# Patient Record
Sex: Female | Born: 1960 | Race: White | Hispanic: No | Marital: Married | Smoking: Never smoker
Health system: Southern US, Community
[De-identification: ages and names within clinical notes are randomized; demographics above are authoritative.]

## PROBLEM LIST (undated history)

## (undated) DIAGNOSIS — R42 Dizziness and giddiness: Secondary | ICD-10-CM

## (undated) DIAGNOSIS — C50919 Malignant neoplasm of unspecified site of unspecified female breast: Secondary | ICD-10-CM

## (undated) DIAGNOSIS — M1990 Unspecified osteoarthritis, unspecified site: Secondary | ICD-10-CM

## (undated) DIAGNOSIS — E785 Hyperlipidemia, unspecified: Secondary | ICD-10-CM

## (undated) DIAGNOSIS — E669 Obesity, unspecified: Secondary | ICD-10-CM

## (undated) DIAGNOSIS — I2699 Other pulmonary embolism without acute cor pulmonale: Secondary | ICD-10-CM

## (undated) DIAGNOSIS — J4489 Other specified chronic obstructive pulmonary disease: Secondary | ICD-10-CM

## (undated) DIAGNOSIS — Z923 Personal history of irradiation: Secondary | ICD-10-CM

## (undated) DIAGNOSIS — M2550 Pain in unspecified joint: Secondary | ICD-10-CM

## (undated) DIAGNOSIS — K219 Gastro-esophageal reflux disease without esophagitis: Secondary | ICD-10-CM

## (undated) DIAGNOSIS — J189 Pneumonia, unspecified organism: Secondary | ICD-10-CM

## (undated) DIAGNOSIS — F329 Major depressive disorder, single episode, unspecified: Secondary | ICD-10-CM

## (undated) DIAGNOSIS — K279 Peptic ulcer, site unspecified, unspecified as acute or chronic, without hemorrhage or perforation: Secondary | ICD-10-CM

## (undated) DIAGNOSIS — N393 Stress incontinence (female) (male): Secondary | ICD-10-CM

## (undated) DIAGNOSIS — R351 Nocturia: Secondary | ICD-10-CM

## (undated) DIAGNOSIS — J449 Chronic obstructive pulmonary disease, unspecified: Secondary | ICD-10-CM

## (undated) DIAGNOSIS — F32A Depression, unspecified: Secondary | ICD-10-CM

## (undated) DIAGNOSIS — R0981 Nasal congestion: Secondary | ICD-10-CM

## (undated) HISTORY — DX: Hyperlipidemia, unspecified: E78.5

## (undated) HISTORY — DX: Major depressive disorder, single episode, unspecified: F32.9

## (undated) HISTORY — DX: Personal history of irradiation: Z92.3

## (undated) HISTORY — DX: Obesity, unspecified: E66.9

## (undated) HISTORY — DX: Dizziness and giddiness: R42

## (undated) HISTORY — DX: Depression, unspecified: F32.A

## (undated) HISTORY — DX: Other pulmonary embolism without acute cor pulmonale: I26.99

## (undated) HISTORY — DX: Gastro-esophageal reflux disease without esophagitis: K21.9

## (undated) HISTORY — DX: Chronic obstructive pulmonary disease, unspecified: J44.9

## (undated) HISTORY — DX: Other specified chronic obstructive pulmonary disease: J44.89

## (undated) HISTORY — DX: Malignant neoplasm of unspecified site of unspecified female breast: C50.919

## (undated) HISTORY — DX: Peptic ulcer, site unspecified, unspecified as acute or chronic, without hemorrhage or perforation: K27.9

---
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
## 2010-08-29 DIAGNOSIS — I2699 Other pulmonary embolism without acute cor pulmonale: Secondary | ICD-10-CM

## 2010-08-29 HISTORY — DX: Other pulmonary embolism without acute cor pulmonale: I26.99

## 2011-10-03 ENCOUNTER — Other Ambulatory Visit: Payer: Self-pay | Admitting: Internal Medicine

## 2011-10-03 DIAGNOSIS — Z1231 Encounter for screening mammogram for malignant neoplasm of breast: Secondary | ICD-10-CM

## 2011-10-06 ENCOUNTER — Ambulatory Visit
Admission: RE | Admit: 2011-10-06 | Discharge: 2011-10-06 | Disposition: A | Discharge status: Home / Self Care | Payer: PRIVATE HEALTH INSURANCE | Source: Ambulatory Visit | Attending: Internal Medicine | Admitting: Internal Medicine

## 2011-10-06 DIAGNOSIS — Z1231 Encounter for screening mammogram for malignant neoplasm of breast: Secondary | ICD-10-CM

## 2012-10-02 ENCOUNTER — Other Ambulatory Visit: Payer: Self-pay | Admitting: Obstetrics and Gynecology

## 2012-10-02 DIAGNOSIS — Z1231 Encounter for screening mammogram for malignant neoplasm of breast: Secondary | ICD-10-CM

## 2012-11-26 ENCOUNTER — Ambulatory Visit (INDEPENDENT_AMBULATORY_CARE_PROVIDER_SITE_OTHER): Payer: BC Managed Care – PPO | Admitting: Neurology

## 2012-11-26 ENCOUNTER — Encounter: Payer: Self-pay | Admitting: Neurology

## 2012-11-26 VITALS — BP 118/74 | HR 68 | Temp 97.8°F | Resp 12 | Ht 64.0 in | Wt 243.0 lb

## 2012-11-26 DIAGNOSIS — H8110 Benign paroxysmal vertigo, unspecified ear: Secondary | ICD-10-CM

## 2012-11-26 MED ORDER — DIAZEPAM 2 MG PO TABS: 2.0000 mg | ORAL_TABLET | Freq: Two times a day (BID) | ORAL | Status: DC

## 2012-11-26 NOTE — Progress Notes (Signed)
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 In 1997 she developed a head cold with headache, congestion or nasal discharge.  Following that, she developed vertigo.  She had a sensation of things rushing past her and she also at one point had positional vertigo. It took her 19 months to get diagnosed.  She finally saw Dr. Danie Chandler at [facility] medicine and was properly diagnosed.  She did the Brandt-Daroff exercises, and after about 3 months she did improve enough to return to work in February of 1998.  She took a plane to [place] for a work-related event.  Her vertigo was exacerbated and then the far department declared her not fit for work.  She was on state disability from 22 until 2002.  At that time she was able to work in the IT job at [employer] in [place]. In 2002 she lost her job and her home and her partner.  At that point she started a new regimen for depression which she continues on which is very helpful.  She moved back to this area and she has been working as a Naval architect.  However, she has not been able to work for the past 8-10 weeks.  She had a head cold and the vertigo returned. She experiences symptoms of room spinning episodes of vertigo lasting 30 seconds when she rolls over in bed with her doctor, and when she gets up out of bed.  She also has a sensation as if things rushing past her again. This seems to bother her more when she is driving him the positional vertigo.  She rarely takes a little meclizine at bedtime.  She is being followed by Dr. Jacky Kindle at [facility].    MRI scan has been performed and this is unremarkable.  She has not had any sinus symptoms at this time.  Review of symptoms is positive for depression controlled with medications, occasional constipation, dry skin.  She specifically denies hearing loss, numbness her headaches. Remainder or of review of symptoms is negative.  Medications include Proventil, xarelto, viibryd.  Abilify, Flonase, Lipitor and rare meclizine.  Codeine allergy Past Medical History  Diagnosis Date  . Vertigo   . Depression   . Pulmonary embolism   . Obesity     History   Social History  . Marital Status: Single    Spouse Name: N/A    Number of Children: N/A  . Years of Education: N/A   Occupational History  . Not on file.   Social History Main Topics  . Smoking status: Former Games developer  . Smokeless tobacco: Not on file     Comment: only smoked for a year  . Alcohol Use: No  . Drug Use: No  . Sexually Active: Not on file   Other Topics Concern  . Not on file   Social History Narrative  . No narrative on file   No family history on file.   BP 118/74  Pulse 68  Temp(Src) 97.8 F (36.6 C)  Resp 12  Ht 5\' 4"  (1.626 m)  Wt 243 lb (110.224 kg)  BMI 41.69 kg/m2   Alert and oriented x 3.  Memory function appears to be intact.  Concentration and attention are normal for educational level and background.  Speech is fluent and without significant word finding difficulty.  Is aware of current events.  No carotid bruits detected.  Cranial nerve II through XII are within normal limits.  This includes normal optic discs and acuity, EOMI, PERLA, facial movement and sensation intact, hearing grossly intact, gag intact,Uvula raises symmetrically and tongue protrudes evenly. Motor strength is 5 over 5 throughout all limbs.  No atrophy, abnormal tone or tremors. Reflexes are 2+ and symmetric in the upper and lower extremities Sensory exam is intact. Coordination is intact for fine movements and rapid alternating movements in all limbs Gait and station reveals difficulty with tandem.   We elected not to do the Hallpike maneuvers today because of the exacerbation of symptoms that she has had with the Brandt-Daroff exercises.  Impression: 1. Benign positional vertigo worse with the left ear down in this 52 year old woman who has prior history of vertigo which resulted in state  disability as a IT sales professional. 2.  She also has another symptom of vertigo of objects rushing pastor as if she's moving forward when she is not.  I presented this is also related to the vestibular system.  Plan: 1. We will add diazepam 2 mg twice a day with breakfast and lunch. 2. She will modify the exercises so that she can tolerate them and we will progress forward until she is doing them in the standard fashion if possible.  This was helpful in the past 3. She returned here in one month to see for making progress with her symptoms, and at that time he may perform the Hallpike maneuver if she is more stable with her vertigo.

## 2012-11-26 NOTE — Patient Instructions (Addendum)
Follow up with Dr. Smiley Houseman in one month.

## 2012-12-27 HISTORY — PX: BREAST BIOPSY: SHX20

## 2012-12-31 ENCOUNTER — Ambulatory Visit (INDEPENDENT_AMBULATORY_CARE_PROVIDER_SITE_OTHER): Payer: BC Managed Care – PPO | Admitting: Neurology

## 2012-12-31 ENCOUNTER — Ambulatory Visit
Admission: RE | Admit: 2012-12-31 | Discharge: 2012-12-31 | Disposition: A | Discharge status: Home / Self Care | Payer: BC Managed Care – PPO | Source: Ambulatory Visit | Attending: Obstetrics and Gynecology | Admitting: Obstetrics and Gynecology

## 2012-12-31 ENCOUNTER — Encounter: Payer: Self-pay | Admitting: Neurology

## 2012-12-31 VITALS — BP 120/70 | HR 80 | Temp 98.0°F | Resp 12 | Ht 64.0 in | Wt 245.0 lb

## 2012-12-31 DIAGNOSIS — H8110 Benign paroxysmal vertigo, unspecified ear: Secondary | ICD-10-CM

## 2012-12-31 DIAGNOSIS — Z1231 Encounter for screening mammogram for malignant neoplasm of breast: Secondary | ICD-10-CM

## 2012-12-31 NOTE — Patient Instructions (Addendum)
Follow-up in 4 weeks

## 2012-12-31 NOTE — Progress Notes (Signed)
Jean [name] returns for followup of vertigo with a 4 week followup visit.  She did find that the physical therapy exercises are helpful to the vertigo, although they did not address the symptom of rushing forward quite as much as the more traditional vertigo symptoms.  However, she had 2 back-to-back sinus infections in the month of April to each correlated with the worsening of her symptoms.  She is taking Flonase and now Sudafed and she does have a history of allergies.  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 She is interested in alternate methods including acupuncture it would be helpful.  She is also uses Zyrtec in the past which was helpful but she is not taking at this particular time.  She is using diazepam sparingly and it has been helpful in reducing symptoms.  Review of systems again reveals occasional gesture trouble and occasional sleep difficulty but is otherwise unremarkable.  Past Medical History  Diagnosis Date  . Vertigo   . Depression   . Pulmonary embolism   . Obesity     Current Outpatient Prescriptions on File Prior to Visit  Medication Sig Dispense Refill  . ARIPiprazole (ABILIFY) 5 MG tablet Take 5 mg by mouth daily.      Marland Kitchen atorvastatin (LIPITOR) 20 MG tablet Take 20 mg by mouth daily.      . diazepam (VALIUM) 2 MG tablet Take 1 tablet (2 mg total) by mouth 2 (two) times daily with breakfast and lunch.  60 tablet  5  . Rivaroxaban (XARELTO PO) Take by mouth.      . Vilazodone HCl (VIIBRYD) 40 MG TABS Take by mouth daily.       No current facility-administered medications on file prior to visit.   Codeine  History   Social History  . Marital Status: Single    Spouse Name: N/A    Number of Children: N/A  . Years of Education: N/A   Occupational History  . Not on file.   Social History Main Topics  . Smoking status: Former Games developer  .  Smokeless tobacco: Never Used     Comment: only smoked for a year  . Alcohol Use: No  . Drug Use: No  . Sexually Active: Not on file   Other Topics Concern  . Not on file   Social History Narrative  . No narrative on file   No family history on file.  BP 120/70  Pulse 80  Temp(Src) 98 F (36.7 C)  Resp 12  Ht 5\' 4"  (1.626 m)  Wt 245 lb (111.131 kg)  BMI 42.03 kg/m2   Alert and oriented x 3.  Memory function appears to be intact.  Concentration and attention are normal for educational level and background.  Speech is fluent and without significant word finding difficulty.  Is aware of current events.  No carotid bruits detected.  Cranial nerve II through XII are within normal limits. At first there was some gaze evoked nystagmus when looking quickly to the left, but this fatigued or was not reporducable. This includes normal optic discs and acuity, EOMI, PERLA, facial movement and sensation intact, hearing grossly intact, gag intact,Uvula raises symmetrically and tongue protrudes evenly. Motor strength is 5 over 5 throughout all limbs.  No atrophy, abnormal tone or tremors. Reflexes are 2+ and symmetric in the upper and lower extremities Sensory exam is  intact. Coordination is intact for fine movements and rapid alternating movements in all limbs Gait and station are normal.   Impression: 1. Continue vertigo exacerbated by sinus infections which may in turn have been exacerbated by nasal allergies during the spring pollen season.  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  The improvement with the exercises is encouraging, however.  Plan: 1. Add Zyrtec for nasal congestion 2. Continue exercises and had some forward and back movement the healthy address the sensation of rushing forward. 3. [facility]up[facility]. 4. Return in 4 weeks.  She can continue diazepam as needed in the meantime.

## 2013-01-02 ENCOUNTER — Other Ambulatory Visit: Payer: Self-pay | Admitting: Obstetrics and Gynecology

## 2013-01-02 DIAGNOSIS — R928 Other abnormal and inconclusive findings on diagnostic imaging of breast: Secondary | ICD-10-CM

## 2013-01-15 ENCOUNTER — Ambulatory Visit
Admission: RE | Admit: 2013-01-15 | Discharge: 2013-01-15 | Disposition: A | Discharge status: Home / Self Care | Payer: BC Managed Care – PPO | Source: Ambulatory Visit | Attending: Obstetrics and Gynecology | Admitting: Obstetrics and Gynecology

## 2013-01-15 ENCOUNTER — Other Ambulatory Visit: Payer: Self-pay | Admitting: Obstetrics and Gynecology

## 2013-01-15 DIAGNOSIS — R928 Other abnormal and inconclusive findings on diagnostic imaging of breast: Secondary | ICD-10-CM

## 2013-01-22 ENCOUNTER — Other Ambulatory Visit (HOSPITAL_COMMUNITY): Payer: Self-pay | Admitting: Radiology

## 2013-01-22 ENCOUNTER — Ambulatory Visit
Admission: RE | Admit: 2013-01-22 | Discharge: 2013-01-22 | Disposition: A | Discharge status: Home / Self Care | Payer: BC Managed Care – PPO | Source: Ambulatory Visit | Attending: Obstetrics and Gynecology | Admitting: Obstetrics and Gynecology

## 2013-01-22 DIAGNOSIS — R928 Other abnormal and inconclusive findings on diagnostic imaging of breast: Secondary | ICD-10-CM

## 2013-01-23 ENCOUNTER — Other Ambulatory Visit: Payer: Self-pay | Admitting: Obstetrics and Gynecology

## 2013-01-23 DIAGNOSIS — C50911 Malignant neoplasm of unspecified site of right female breast: Secondary | ICD-10-CM

## 2013-01-24 ENCOUNTER — Telehealth: Payer: Self-pay | Admitting: *Deleted

## 2013-01-24 DIAGNOSIS — C50211 Malignant neoplasm of upper-inner quadrant of right female breast: Secondary | ICD-10-CM

## 2013-01-24 NOTE — Telephone Encounter (Signed)
Called and spoke with patient to schedule her for [facility].  Confirmed appt. For 01/30/13 at 0800. All questions answered.  No further needs at this time.

## 2013-01-28 ENCOUNTER — Ambulatory Visit
Admission: RE | Admit: 2013-01-28 | Discharge: 2013-01-28 | Disposition: A | Discharge status: Home / Self Care | Payer: BC Managed Care – PPO | Source: Ambulatory Visit | Attending: Obstetrics and Gynecology | Admitting: Obstetrics and Gynecology

## 2013-01-28 DIAGNOSIS — C50911 Malignant neoplasm of unspecified site of right female breast: Secondary | ICD-10-CM

## 2013-01-28 MED ORDER — GADOBENATE DIMEGLUMINE 529 MG/ML IV SOLN
20.0000 mL | Freq: Once | INTRAVENOUS | Status: AC | PRN
  Administered 2013-01-28: 20 mL via INTRAVENOUS

## 2013-01-30 ENCOUNTER — Encounter: Payer: Self-pay | Admitting: Oncology

## 2013-01-30 ENCOUNTER — Ambulatory Visit (HOSPITAL_BASED_OUTPATIENT_CLINIC_OR_DEPARTMENT_OTHER): Payer: BC Managed Care – PPO | Admitting: Surgery

## 2013-01-30 ENCOUNTER — Ambulatory Visit: Payer: BC Managed Care – PPO

## 2013-01-30 ENCOUNTER — Encounter: Payer: Self-pay | Admitting: *Deleted

## 2013-01-30 ENCOUNTER — Encounter (INDEPENDENT_AMBULATORY_CARE_PROVIDER_SITE_OTHER): Payer: Self-pay | Admitting: Surgery

## 2013-01-30 ENCOUNTER — Ambulatory Visit
Admission: RE | Admit: 2013-01-30 | Discharge: 2013-01-30 | Disposition: A | Discharge status: Home / Self Care | Payer: BC Managed Care – PPO | Source: Ambulatory Visit | Attending: Radiation Oncology | Admitting: Radiation Oncology

## 2013-01-30 ENCOUNTER — Telehealth: Payer: Self-pay | Admitting: Oncology

## 2013-01-30 ENCOUNTER — Ambulatory Visit: Payer: BC Managed Care – PPO | Attending: Surgery | Admitting: Physical Therapy

## 2013-01-30 ENCOUNTER — Other Ambulatory Visit (HOSPITAL_BASED_OUTPATIENT_CLINIC_OR_DEPARTMENT_OTHER): Payer: BC Managed Care – PPO | Admitting: Lab

## 2013-01-30 ENCOUNTER — Ambulatory Visit (HOSPITAL_BASED_OUTPATIENT_CLINIC_OR_DEPARTMENT_OTHER): Payer: BC Managed Care – PPO | Admitting: Oncology

## 2013-01-30 VITALS — BP 127/81 | HR 76 | Temp 97.8°F | Resp 20 | Ht 64.0 in | Wt 248.5 lb

## 2013-01-30 DIAGNOSIS — C50211 Malignant neoplasm of upper-inner quadrant of right female breast: Secondary | ICD-10-CM

## 2013-01-30 DIAGNOSIS — F329 Major depressive disorder, single episode, unspecified: Secondary | ICD-10-CM

## 2013-01-30 DIAGNOSIS — C50219 Malignant neoplasm of upper-inner quadrant of unspecified female breast: Secondary | ICD-10-CM

## 2013-01-30 DIAGNOSIS — Z01818 Encounter for other preprocedural examination: Secondary | ICD-10-CM | POA: Insufficient documentation

## 2013-01-30 DIAGNOSIS — M25619 Stiffness of unspecified shoulder, not elsewhere classified: Secondary | ICD-10-CM | POA: Insufficient documentation

## 2013-01-30 DIAGNOSIS — C50919 Malignant neoplasm of unspecified site of unspecified female breast: Secondary | ICD-10-CM

## 2013-01-30 DIAGNOSIS — C50911 Malignant neoplasm of unspecified site of right female breast: Secondary | ICD-10-CM

## 2013-01-30 DIAGNOSIS — Z170 Estrogen receptor positive status [ER+]: Secondary | ICD-10-CM

## 2013-01-30 DIAGNOSIS — R293 Abnormal posture: Secondary | ICD-10-CM | POA: Insufficient documentation

## 2013-01-30 DIAGNOSIS — Z86711 Personal history of pulmonary embolism: Secondary | ICD-10-CM

## 2013-01-30 DIAGNOSIS — IMO0001 Reserved for inherently not codable concepts without codable children: Secondary | ICD-10-CM | POA: Insufficient documentation

## 2013-01-30 LAB — CBC WITH DIFFERENTIAL/PLATELET
Basophils Absolute: 0 10*3/uL (ref 0.0–0.1)
EOS%: 5.4 % (ref 0.0–7.0)
HCT: 35.8 % (ref 34.8–46.6)
HGB: 12.4 g/dL (ref 11.6–15.9)
MCH: 30 pg (ref 25.1–34.0)
MCV: 86.7 fL (ref 79.5–101.0)
MONO%: 14.6 % — ABNORMAL HIGH (ref 0.0–14.0)
NEUT%: 54.1 % (ref 38.4–76.8)

## 2013-01-30 LAB — COMPREHENSIVE METABOLIC PANEL (CC13)
AST: 18 U/L (ref 5–34)
Alkaline Phosphatase: 99 U/L (ref 40–150)
BUN: 14.8 mg/dL (ref 7.0–26.0)
Calcium: 9.4 mg/dL (ref 8.4–10.4)
Creatinine: 0.8 mg/dL (ref 0.6–1.1)

## 2013-01-30 NOTE — Progress Notes (Signed)
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 female who is seen at the [facility] today through the courtesy Dr. Magnus Ivan for evaluation of her T1 N0 invasive ductal/DCIS of the right breast. At the time of a screening mammogram on 12/31/2012 she was found to have a possible mass in the central/upper inner quadrant of the right breast.. Additional views showed an area distortion within the upper inner quadrant of the right breast for which she underwent stereotactic biopsy. This area was not seen on ultrasound. This was felt to measure 1.0 cm on mammography. Her biopsy on 01/22/2013 was diagnostic for invasive ductal carcinoma along with DCIS. Microcalcifications were present. She was ER/PR positive with a Ki 67 of 10%. Breast MR on 01/28/2013 showed a slightly suspicious 1.7 cm lymph node along the anterior inferior right axillary region in addition to a hematoma to 4.5 x 6.1 x 4.4 cm with patchy nodular enhancement along the periphery of the hematoma likely related to her recent biopsy.    PREVIOUS RADIATION THERAPY: No   PAST MEDICAL HISTORY:  has a past medical history of Vertigo; Depression; Pulmonary embolism; Obesity; and Breast cancer.     PAST SURGICAL HISTORY:  Past Surgical History  Procedure Laterality Date  . Adenoidectomy  1967     FAMILY HISTORY: family history includes Breast cancer in her cousin, maternal aunt, and maternal grandmother. Her biologic mother died at age 94 from complications of diabetes mellitus. Her maternal grandmother was diagnosed with breast cancer along with a maternal aunt and a first cousin who  was diagnosed at age 30.   SOCIAL HISTORY:  reports that she has quit smoking. She has never used smokeless tobacco. She reports that she does not drink alcohol or use illicit drugs. 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   ALLERGIES: Codeine   MEDICATIONS:  Current Outpatient Prescriptions  Medication Sig Dispense Refill  . ARIPiprazole (ABILIFY) 5 MG tablet Take 5 mg by mouth daily. 2.5 mg daily      . atorvastatin (LIPITOR) 20 MG tablet Take 20 mg by mouth daily.      . celecoxib (CELEBREX) 200 MG capsule Take 200 mg by mouth 2 (two) times daily.      . pseudoephedrine (SUDAFED) 30 MG tablet Take 30 mg by mouth every 4 (four) hours as needed for congestion.      . Rivaroxaban (XARELTO PO) Take 20 mg by mouth daily.       . Vilazodone HCl (VIIBRYD) 40 MG TABS Take by mouth daily.       No current facility-administered medications for this encounter.     REVIEW OF SYSTEMS:  Pertinent items are noted in HPI.    PHYSICAL EXAM: Alert and oriented 52 year old white female appearing her stated age. Wt Readings from Last 3 Encounters:  01/30/13 248 lb 8 oz (112.719 kg)  12/31/12 245 lb (111.131 kg)  11/26/12 243 lb (110.224 kg)   Temp Readings from Last 3 Encounters:  01/30/13 97.8 F (36.6 C) Oral  12/31/12 98 F (36.7 C)  11/26/12 97.8 F (36.6 C)    BP Readings from Last 3 Encounters:  01/30/13 127/81  12/31/12 120/70  11/26/12 118/74   Pulse Readings from Last 3 Encounters:  01/30/13 76  12/31/12 80  11/26/12 68   Head and neck examination: Grossly unremarkable. Nodes: Without palpable cervical, supraclavicular, or axillary lymphadenopathy. Specifically, I do not feel any right axillary adenopathy. Chest: Lungs clear. Heart: Regular in rhythm. Breasts: On inspection of the right breast there is a large area of ecchymosis involving almost the entire breast with a biopsy wound at 2:00. No discreet masses are appreciated. Left breast without  masses or lesions. Abdomen without hepatomegaly. Extremities without edema. Neurologic simulation: Grossly nonfocal.    LABORATORY DATA:  Lab Results  Component Value Date   WBC 5.3 01/30/2013   HGB 12.4 01/30/2013   HCT 35.8 01/30/2013   MCV 86.7 01/30/2013   PLT 211 01/30/2013   Lab Results  Component Value Date   NA 141 01/30/2013   K 4.1 01/30/2013   CL 105 01/30/2013   CO2 27 01/30/2013   Lab Results  Component Value Date   ALT 23 01/30/2013   AST 18 01/30/2013   ALKPHOS 99 01/30/2013   BILITOT 0.61 01/30/2013      IMPRESSION: Clinical stage I (T1, N0, M0) invasive ductal/DCIS of the right breast. She appears to be a candidate for breast preservation. We discussed mastectomy versus partial mastectomy followed by radiation therapy. Dr. Magnus Ivan feels that he can remove the suspicious node seen on MRI at the time of her sentinel lymph node biopsy. We discussed the potential acute and late toxicities of radiation therapy, she may benefit from prone irradiation if she is node negative. Dr. Darnelle Catalan will obtain Oncotype DX testing, and she will have genetic counseling based on her family history.   PLAN: As discussed above.  I spent 40 minutes minutes face to face with the patient and more than 50% of that time was spent in counseling and/or coordination of care.

## 2013-01-30 NOTE — Progress Notes (Signed)
Patient ID: Jean [name], female   DOB: May 09, 1961, 52 y.o.   MRN: [id number]  Chief Complaint  Patient presents with  . Other    right breast cancer    HPI Jean [name] is a 52 y.o. female.   HPI This is a very pleasant female referred by Dr. Geoffry Paradise after the recent diagnosis of a right breast cancer. A lesion was found on screening mammography. Biopsy confirmed invasive cancer. She has no previous problems with her breast. She denies nipple discharge. She is otherwise without complaints. Past Medical History  Diagnosis Date  . Vertigo   . Depression   . Pulmonary embolism   . Obesity   . Breast cancer     Past Surgical History  Procedure Laterality Date  . Adenoidectomy  1967    Family History  Problem Relation Age of Onset  . Breast cancer Maternal Aunt   . Breast cancer Maternal Grandmother   . Breast cancer Cousin     Social History History  Substance Use Topics  . Smoking status: Former Games developer  . Smokeless tobacco: Never Used     Comment: only smoked for a year  . Alcohol Use: No    Allergies  Allergen Reactions  . Codeine Shortness Of Breath    Current Outpatient Prescriptions  Medication Sig Dispense Refill  . ARIPiprazole (ABILIFY) 5 MG tablet Take 5 mg by mouth daily.      Marland Kitchen atorvastatin (LIPITOR) 20 MG tablet Take 20 mg by mouth daily.      . diazepam (VALIUM) 2 MG tablet Take 1 tablet (2 mg total) by mouth 2 (two) times daily with breakfast and lunch.  60 tablet  5  . pseudoephedrine (SUDAFED) 30 MG tablet Take 30 mg by mouth every 4 (four) hours as needed for congestion.      . Rivaroxaban (XARELTO PO) Take by mouth.      . Vilazodone HCl (VIIBRYD) 40 MG TABS Take by mouth daily.       No current facility-administered medications for this visit.    Review of Systems Review of Systems  Constitutional: Negative for fever, chills and unexpected weight change.  HENT: Negative for hearing loss, congestion, sore throat, trouble  swallowing and voice change.   Eyes: Negative for visual disturbance.  Respiratory: Negative for cough and wheezing.   Cardiovascular: Negative for chest pain, palpitations and leg swelling.  Gastrointestinal: Negative for nausea, vomiting, abdominal pain, diarrhea, constipation, blood in stool, abdominal distention and anal bleeding.  Genitourinary: Negative for hematuria, vaginal bleeding and difficulty urinating.  Musculoskeletal: Negative for arthralgias.  Skin: Negative for rash and wound.  Neurological: Negative for seizures, syncope and headaches.  Hematological: Negative for adenopathy. Does not bruise/bleed easily.  Psychiatric/Behavioral: Negative for confusion.  Breast: There is a large hematoma with ecchymosis at the 12:00 position of the right breast. There are no other palpable masses negative breast.   There were no vitals taken for this visit.  Physical Exam Physical Exam  Constitutional: She is oriented to person, place, and time. She appears well-developed.  obese  HENT:  Head: Normocephalic and atraumatic.  Right Ear: External ear normal.  Left Ear: External ear normal.  Nose: Nose normal.  Mouth/Throat: Oropharynx is clear and moist. No oropharyngeal exudate.  Eyes: Conjunctivae are normal. Pupils are equal, round, and reactive to light. Right eye exhibits no discharge. Left eye exhibits no discharge. No scleral icterus.  Neck: Normal range of motion. Neck supple. No tracheal deviation present. No thyromegaly  present.  Cardiovascular: Normal rate, regular rhythm and normal heart sounds.   Pulmonary/Chest: Effort normal and breath sounds normal. No respiratory distress. She has no wheezes.  Abdominal: Soft. Bowel sounds are normal. She exhibits no distension. There is no tenderness. There is no rebound.  Musculoskeletal: Normal range of motion. She exhibits no edema and no tenderness.  Lymphadenopathy:    She has no cervical adenopathy.    She has no axillary  adenopathy.  Neurological: She is alert and oriented to person, place, and time.  Skin: Skin is warm and dry. No rash noted. No erythema.  Psychiatric: Her behavior is normal. Judgment normal.    Data Reviewed Mammograms demonstrate the 1 cm mass inferior to the nipple on the right breast. No masses seen on ultrasound. MRI shows the biopsy area which is less than a centimeter in size with a large hematoma and some nodular enhancement adjacent. Pathology shows and ER/PR positive invasive breast cancer  Assessment     right breast cancer     Plan    After discussion in the multidisciplinary breast conference, lumpectomy and sentinel nerve biopsy is recommended. I discussed this with the patient and her family in detail. I did discuss lumpectomy versus mastectomy. She wishes to proceed with breast conservation. She understands that if the margins are positive, she may need removal of the nipple and areolar complex and she is very comfortable with that. I discussed the risks of surgery which includes but not limited to bleeding, infection, injury to stranding structures, need for further surgery, et Karie Soda. She will stop her anticoagulation medicines preoperatively. Surgery will be scheduled.        Jean [name] A 01/30/2013, 9:28 AM

## 2013-01-30 NOTE — Progress Notes (Signed)
Checked in new patient. No financial issues right now. She is not working now, but does have insurance([employer]). She does have a living will but did not have with her. I also gave her a [facility] form to fill out.

## 2013-01-30 NOTE — Progress Notes (Signed)
ID: Haskel Khan OB: [birth year]  MR#: [id number]  WUJ#:[id number]  PCP: Minda Meo, MD GYN:  Debbora Dus NP SU: Abigail Miyamoto OTHER MD: Chipper Herb, Rupinder Tiney Rouge   HISTORY OF PRESENT ILLNESS: "Jean [name]" had routine mammographic screening 10/02/2012 showing dense breasts (category C.). A possible distortion was noted in the right breast and a diagnostic right mammography and ultrasonography on 01/02/2013 confirmed a persistent area of distortion in the upper inner quadrant of the right breast measuring approximately 1 cm. This was not palpable. Ultrasound was not informative and showed no axillary adenopathy. 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  Bilateral breast MRIs 01/28/2013 showed a large hematoma at the biopsy site, measuring up to 6.1 cm, with minimal patchy enhancing nodularity at the periphery. There was a 1.7 cm lymph node in the right axillary region with a preserved fatty hilum but slightly thickened cortex. There were no other findings of concern.  The patient's subsequent history is as detailed below  INTERVAL HISTORY: The patient was evaluated at the multidisciplinary breast cancer clinic 01/30/2013 accompanied by her mother and her friend Lynden Ang  REVIEW OF SYSTEMS: There were no specific symptoms leading to the initial mammogram, which was scheduled routinely. Jean [name] does have significant vertigo problems, and was on leave from her job as a result. 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 She is very concerned about her weight and is interested in meeting with a dietitian to get a weight loss plan. She has problems with heartburn, stress urinary incontinence, depression, and hot flashes, all of which are being addressed and  are currently under good control. A detailed review of systems was otherwise noncontributory  PAST MEDICAL HISTORY: Past Medical History  Diagnosis Date  . Vertigo   . Depression   . Pulmonary embolism 2012    on chronic anticoagulation  . Obesity   . Breast cancer   . Hyperlipidemia   . GERD (gastroesophageal reflux disease)   . PUD (peptic ulcer disease)     remote  . COPD with asthma     PAST SURGICAL HISTORY: Past Surgical History  Procedure Laterality Date  . Adenoidectomy  1967    FAMILY HISTORY Family History  Problem Relation Age of Onset  . Breast cancer Maternal Aunt   . Breast cancer Maternal Grandmother   . Breast cancer Cousin    the patient is adopted and has no information on her father's side of the family. Her biological mother is currently 40. Her biological mothers mother had a diagnosis of breast cancer but the patient does not know at what age. Also 1 maternal aunt was diagnosed with breast cancer as well as a first cousin, and a first cousin was diagnosed at age 69. The patient is being set up for genetic counseling.  GYNECOLOGIC HISTORY:  Menarche age 44, perimenopause at around age 62. The patient then took hormone replacement for 5 years, quitting at age 33 when she developed a pulmonary embolus. She never took birth control pills. She is GX P0.  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 At home she lives with her adoptive mother [name] and her adoptive brother [name]. The patient's adoptive father is currently at [facility] following orthopedic surgery. There  is a second adoptive brother. The patient herself is single. The only pet at home is a Estate manager/land agent    ADVANCED DIRECTIVES: In place. The patient has named her adoptive mother, [name], as her healthcare power of attorney. Barbara's cell number is [phone]   HEALTH MAINTENANCE: History  Substance Use Topics  . Smoking status: Former Games developer  .  Smokeless tobacco: Never Used     Comment: only smoked for a year  . Alcohol Use: No     Colonoscopy:  PAP: 2014  Bone density:  Lipid panel:  Allergies  Allergen Reactions  . Codeine Shortness Of Breath    Current Outpatient Prescriptions  Medication Sig Dispense Refill  . ARIPiprazole (ABILIFY) 5 MG tablet Take 5 mg by mouth daily. 2.5 mg daily      . atorvastatin (LIPITOR) 20 MG tablet Take 20 mg by mouth daily.      . celecoxib (CELEBREX) 200 MG capsule Take 200 mg by mouth 2 (two) times daily.      . pseudoephedrine (SUDAFED) 30 MG tablet Take 30 mg by mouth every 4 (four) hours as needed for congestion.      . Rivaroxaban (XARELTO PO) Take 20 mg by mouth daily.       . Vilazodone HCl (VIIBRYD) 40 MG TABS Take by mouth daily.       No current facility-administered medications for this visit.    OBJECTIVE: Middle-aged white woman in no acute distress Filed Vitals:   01/30/13 0836  BP: 127/81  Pulse: 76  Temp: 97.8 F (36.6 C)  Resp: 20     Body mass index is 42.63 kg/(m^2).    ECOG FS:  Sclerae unicteric Oropharynx clear No cervical or supraclavicular adenopathy Lungs no rales or rhonchi Heart regular rate and rhythm Abd obese, benign MSK no focal spinal tenderness Neuro: non-focal, well-oriented, appropriate affect Breasts: The right breast is status post recent biopsy. There is a significant ecchymosis, and a palpable mass which we know by MRI is mostly hematoma. The right axilla is clear. The left breast is unremarkable.   LAB RESULTS:  CMP     Component Value Date/Time   NA 141 01/30/2013 0823   K 4.1 01/30/2013 0823   CL 105 01/30/2013 0823   CO2 27 01/30/2013 0823   GLUCOSE 104* 01/30/2013 0823   BUN 14.8 01/30/2013 0823   CREATININE 0.8 01/30/2013 0823   CALCIUM 9.4 01/30/2013 0823   PROT 7.0 01/30/2013 0823   ALBUMIN 3.3* 01/30/2013 0823   AST 18 01/30/2013 0823   ALT 23 01/30/2013 0823   ALKPHOS 99 01/30/2013 0823   BILITOT 0.61 01/30/2013 0823    I No results  found for this basename: SPEP, UPEP,  kappa and lambda light chains    Lab Results  Component Value Date   WBC 5.3 01/30/2013   NEUTROABS 2.8 01/30/2013   HGB 12.4 01/30/2013   HCT 35.8 01/30/2013   MCV 86.7 01/30/2013   PLT 211 01/30/2013      Chemistry      Component Value Date/Time   NA 141 01/30/2013 0823   K 4.1 01/30/2013 0823   CL 105 01/30/2013 0823   CO2 27 01/30/2013 0823   BUN 14.8 01/30/2013 0823   CREATININE 0.8 01/30/2013 0823      Component Value Date/Time   CALCIUM 9.4 01/30/2013 0823   ALKPHOS 99 01/30/2013 0823   AST 18 01/30/2013 0823   ALT 23 01/30/2013 0823   BILITOT 0.61 01/30/2013 4782  No results found for this basename: LABCA2    No components found with this basename: LABCA125    No results found for this basename: INR,  in the last 168 hours  Urinalysis No results found for this basename: colorurine, appearanceur, labspec, phurine, glucoseu, hgbur, bilirubinur, ketonesur, proteinur, urobilinogen, nitrite, leukocytesur    STUDIES: US Breast Right  01/15/2013   *RADIOLOGY REPORT*  Clinical Data:  Recall from screening mammography. The patient is adopted. However, she has recently located her birth mother and she states that there is a family history of breast cancer in her maternal grandmother, maternal aunts, and maternal cousin.  DIGITAL DIAGNOSTIC RIGHT BREAST MAMMOGRAM WITH CAD AND RIGHT BREAST ULTRASOUND:  Comparison:  12/31/2012, 10/07/2011.  Findings:  ACR Breast Density Category 3: The breast tissue is heterogeneously dense.  Additional views of the right breast demonstrate a persistent small area of distortion located within the upper inner quadrant of the right breast.  By mammography this measures approximately 1 cm in size.  This persists on right CC views with 15 degrees of medial and lateral angulation and, as a result, should be accessible to stereotactic biopsy.  Mammographic images were processed with CAD.  On physical exam, there is no discrete palpable  abnormality within the medial right breast or upper inner quadrant of the right breast.  Ultrasound is performed, showing an area of questionable distortion located within the right breast at 2 o'clock position 5 cm from nipple.  However, a BB was placed overlying the area on ultrasound and this does not correspond to the mammographic finding.  Ultrasound of the right axilla demonstrates no adenopathy and normal axillary contents.  IMPRESSION: Persistent area of distortion located within the upper inner quadrant of the right breast.  Tissue sampling is recommended and stereotactic core biopsy will be scheduled.  RECOMMENDATION: Right breast stereotactic core biopsy.  I have discussed the findings and recommendations with the patient. Results were also provided in writing at the conclusion of the visit.  If applicable, a reminder letter will be sent to the patient regarding the next appointment.  BI-RADS CATEGORY 4:  Suspicious abnormality - biopsy should be considered.   Original Report Authenticated By: Rolla Plate, M.D.   Mr Breast Bilateral W Wo Contrast  01/28/2013   *RADIOLOGY REPORT*  Clinical Data: History of recent ultrasound guided malignant core biopsy 01/15/2013 demonstrating invasive ductal carcinoma with DCIS.  None  BILATERAL BREAST MRI WITH AND WITHOUT CONTRAST  Technique: Multiplanar, multisequence MR images of both breasts were obtained prior to and following the intravenous administration of 20ml of multihance.  Three dimensional images were evaluated at the independent DynaCad workstation.  Comparison:  Recent mammograms and ultrasounds.  Findings: Examination demonstrates minimal background parenchymal enhancement.  Right breast:  Metallic clip artifact is seen over the upper inner right breast from the patient's recent ultrasound core biopsy. There is a hematoma at the biopsy site measuring approximately 4.5 x 6.1 x 4.4 cm.  There is minimal patchy enhancing nodularity along the periphery  of this hematoma likely related to patient's biopsy- proven malignancy at this site versus postsurgical change.  There is a 1.7 cm lymph node over the anterior inferior aspect of the right axillary region immediately lateral to the pectoralis major muscle as this node has a fatty hilum although slightly thickened cortex and malignancy cannot be excluded.  Left breast:  No suspicious masses, enhancement or adenopathy.  IMPRESSION: Known biopsy-proven malignancy over the upper inner right breast. Slightly suspicious 1.7 cm  lymph node over the anterior inferior right axillary region as malignancy cannot be excluded.  RECOMMENDATION: Recommend continued follow up as per clinical treatment plan.  THREE-DIMENSIONAL MR IMAGE RENDERING ON INDEPENDENT WORKSTATION:  Three-dimensional MR images were rendered by post-processing of the original MR data on an independent workstation.  The three- dimensional MR images were interpreted, and findings were reported in the accompanying complete MRI report for this study.  BI-RADS CATEGORY 6:  Known biopsy-proven malignancy - appropriate action should be taken.   Original Report Authenticated By: Elberta Fortis, M.D.   Mm Digital Diag Ltd R  01/15/2013   *RADIOLOGY REPORT*  Clinical Data:  Recall from screening mammography. The patient is adopted. However, she has recently located her birth mother and she states that there is a family history of breast cancer in her maternal grandmother, maternal aunts, and maternal cousin.  DIGITAL DIAGNOSTIC RIGHT BREAST MAMMOGRAM WITH CAD AND RIGHT BREAST ULTRASOUND:  Comparison:  12/31/2012, 10/07/2011.  Findings:  ACR Breast Density Category 3: The breast tissue is heterogeneously dense.  Additional views of the right breast demonstrate a persistent small area of distortion located within the upper inner quadrant of the right breast.  By mammography this measures approximately 1 cm in size.  This persists on right CC views with 15 degrees of  medial and lateral angulation and, as a result, should be accessible to stereotactic biopsy.  Mammographic images were processed with CAD.  On physical exam, there is no discrete palpable abnormality within the medial right breast or upper inner quadrant of the right breast.  Ultrasound is performed, showing an area of questionable distortion located within the right breast at 2 o'clock position 5 cm from nipple.  However, a BB was placed overlying the area on ultrasound and this does not correspond to the mammographic finding.  Ultrasound of the right axilla demonstrates no adenopathy and normal axillary contents.  IMPRESSION: Persistent area of distortion located within the upper inner quadrant of the right breast.  Tissue sampling is recommended and stereotactic core biopsy will be scheduled.  RECOMMENDATION: Right breast stereotactic core biopsy.  I have discussed the findings and recommendations with the patient. Results were also provided in writing at the conclusion of the visit.  If applicable, a reminder letter will be sent to the patient regarding the next appointment.  BI-RADS CATEGORY 4:  Suspicious abnormality - biopsy should be considered.   Original Report Authenticated By: Rolla Plate, M.D.   Mm Digital Screening  01/01/2013   *RADIOLOGY REPORT*  Clinical Data: Screening.  DIGITAL BILATERAL SCREENING MAMMOGRAM WITH CAD DIGITAL BREAST TOMOSYNTHESIS  Digital breast tomosynthesis images are acquired in two projections.  These images are reviewed in combination with the digital mammogram, confirming the findings below.  Comparison: Previous exams.  FINDINGS:  ACR Breast Density Category 3: The breast tissue is heterogeneously dense.  In the right breast, possible distortion warrants further evaluation with spot compression views and possibly ultrasound.  In the left breast, no suspicious masses or malignant type calcifications are identified.  Images were processed with CAD.  IMPRESSION: Further  evaluation is suggested for distortion in the right breast.  RECOMMENDATION:  Diagnostic mammogram and possibly ultrasound of the right breast. (Code:FI-R-45M)  The patient will be contacted regarding the findings, and additional imaging will be scheduled.  BI-RADS CATEGORY 0:  Incomplete.  Need additional imaging evaluation and/or prior mammograms for comparison.   Original Report Authenticated By: Hulan Saas, M.D.   Mm Rt Breast Bx W Loc Dev  1st Lesion Image Bx Spec Stereo Guide  01/28/2013   **ADDENDUM** CREATED: 01/28/2013 10:10:26  Addendum dictated by Dr. Azucena Kuba on 01/28/2013.  The original report was dictated by Dr. Jean Rosenthal.  The patient returned on 01/28/2013 following a diagnostic MRI of the breasts due to a concern about extensive right breast bruising with associated swelling and tenderness since her biopsy.  She also reports intermittent dark bloody fluid leaking from the biopsy incision.  The patient is taking Xarelto for a history of DVT.  On physical examination, the patient has bruising involving the majority of the right breast with increased firmness of the right breast compared to the left breast.  No significant difference in size of the right breast compared to the left breast. There is diffusely increased warmth of the skin of the right breast compared to the left breast with no skin redness.  She has an approximately 8 x 6 cm oval area of palpable firmness at the location of the biopsy site, compatible with a palpable hematoma.  The patient states that she is able to take a 10-day course of 200 mg of Celebrex twice a day without interfering with the Xarelto. Therefore she was instructed to do that to improve the warmth and tenderness of the right breast resulting from an inflammatory reaction to the blood in the breast.  She was asked to return for further evaluation if she develops redness and increased swelling of the breast.  **END ADDENDUM** SIGNED BY: Londell Moh. Azucena Kuba, M.D.  01/23/2013    **ADDENDUM** CREATED: 01/23/2013 10:58:18  The pathology associated with the right breast stereotactic core biopsy demonstrated invasive ductal carcinoma with DCIS.  This is concordant with imaging findings.  I have discussed the findings with the patient by telephone and answered her questions.  The patient states she has mild tenderness at the biopsy site but no hematoma formation or signs of infection.  Post biopsy wound care instructions were reviewed with the patient.  The patient is scheduled for the breast cancer multidisciplinary clinic on 01/30/2013.  Breast MRI will be scheduled.  The patient was encouraged to call the Breast Center for additional questions or concerns.  **END ADDENDUM** SIGNED BY: Rolla Plate, M.D.  01/22/2013   *RADIOLOGY REPORT*  Clinical Data:  Right breast distortion.  STEREOTACTIC-GUIDED VACUUM ASSISTED BIOPSY OF THE RIGHT BREAST AND SPECIMEN RADIOGRAPH  Comparison: Previous exams.  I met with the patient and we discussed the procedure of stereotactic-guided biopsy, including benefits and alternatives. We discussed the high likelihood of a successful procedure. We discussed the risks of the procedure, including infection, bleeding, tissue injury, clip migration, and inadequate sampling. Informed, written consent was given.  Using sterile technique and 2% Lidocaine as local anesthetic, under stereotactic guidance, a 9 gauge vacuum-assisted device was used to perform core needle biopsy of the area of distortion of located within the right breast utilizing a superior to inferior craniocaudal approach. Specimen mammography was not performed as there were no calcifications.  At the conclusion of the procedure, a T-shaped tissue marker clip was deployed into the biopsy cavity.  Follow-up 2-view mammogram confirmed clip to be in appropriate position.  The usual time-out protocol was performed immediately prior to the procedure.  IMPRESSION: Stereotactic-guided biopsy of area of  distortion within the right breast as discussed above.  No apparent complications.   Original Report Authenticated By: Rolla Plate, M.D.    ASSESSMENT: 52 y.o. [place] woman status post right breast biopsy 01/22/2013 for a clinical T1b NX invasive  ductal carcinoma, grade 2, estrogen receptor 90% positive, progesterone receptor 90% positive, with no HER-2 amplification and an MIB-1 of 10%.  PLAN: We spent the better part of today's hour-long visit discussing the specifics of the patient's situation. She understands if the enlarged lymph node we see in her axilla is positive for cancer, then she will definitely need chemotherapy. If the lymph node is negative we will send an Oncotype DX and depending on those results we will make the chemotherapy decision. Accordingly I have made Jean [name] a return appointment in approximately 5 weeks, by which time we will have all the necessary date.  She is concerned regarding weight reduction. I have placed a call to our dietitian to see how we can arrange for that. She also needs genetics counseling and I have requested a meeting with our counselor. I would expect we will have those results within 2 months.  Otherwise the plan is to start with breast conservation therapy, and then make a definite decision regarding chemotherapy. I she does need chemotherapy would be the next that, otherwise we will proceed to radiation. After that she will need 5-10 years of antiestrogen therapy. Jean [name] has a good understanding of all this. She knows to call for any problems that may develop before her next visit here.  Lowella Dell, MD   01/30/2013 11:12 AM

## 2013-01-31 ENCOUNTER — Encounter (HOSPITAL_COMMUNITY): Payer: Self-pay | Admitting: Pharmacy Technician

## 2013-02-05 ENCOUNTER — Telehealth: Payer: Self-pay | Admitting: *Deleted

## 2013-02-05 NOTE — Pre-Procedure Instructions (Signed)
Jean [name]  02/05/2013   Your procedure is scheduled on:  Thurs, June 12 @ 10:00 AM              Report to [facility] at 7:00 AM.  Call this number if you have problems the morning of surgery: [phone]   Remember:   Do not eat food or drink liquids after midnight.   Take these medicines the morning of surgery with A SIP OF WATER: Abilify(Aripiprazole)              No Aspirin,Goody's,BC's,Aleve,Ibuprofen,Fish Oil,or any Herbal Medication   Do not wear jewelry, make-up or nail polish.  Do not wear lotions, powders, or perfumes. You may wear deodorant.  Do not shave 48 hours prior to surgery. Men may shave face and neck.  Do not bring valuables to the hospital.  [facility] is not responsible                   for any belongings or valuables.  Contacts, dentures or bridgework may not be worn into surgery.  Leave suitcase in the car. After surgery it may be brought to your room.     Patients discharged the day of surgery will not be allowed to drive  home.    Special Instructions: Shower using CHG 2 nights before surgery and the night before surgery.  If you shower the day of surgery use CHG.  Use special wash - you have one bottle of CHG for all showers.  You should use approximately 1/3 of the bottle for each shower.   Please read over the following fact sheets that you were given: Pain Booklet, Coughing and Deep Breathing, MRSA Information and Surgical Site Infection Prevention

## 2013-02-05 NOTE — Telephone Encounter (Signed)
Spoke to pt concerning [facility] from 01/30/13.  Pt denies questions or concerns regarding dx or treatment care plan.  Confirmed surgery date and f/u appt with Dr.Magrinat.  Discussed when Oncotype Dx would be sent.  Encourage pt to call with needs.  Received verbal understanding.  Contact information given.

## 2013-02-06 ENCOUNTER — Encounter (HOSPITAL_COMMUNITY)
Admission: RE | Admit: 2013-02-06 | Discharge: 2013-02-06 | Disposition: A | Discharge status: Home / Self Care | Payer: BC Managed Care – PPO | Source: Ambulatory Visit | Attending: Anesthesiology | Admitting: Anesthesiology

## 2013-02-06 ENCOUNTER — Encounter (HOSPITAL_COMMUNITY)
Admission: RE | Admit: 2013-02-06 | Discharge: 2013-02-06 | Disposition: A | Discharge status: Home / Self Care | Payer: BC Managed Care – PPO | Source: Ambulatory Visit | Attending: Surgery | Admitting: Surgery

## 2013-02-06 ENCOUNTER — Encounter (HOSPITAL_COMMUNITY): Payer: Self-pay

## 2013-02-06 HISTORY — DX: Stress incontinence (female) (male): N39.3

## 2013-02-06 HISTORY — DX: Unspecified osteoarthritis, unspecified site: M19.90

## 2013-02-06 HISTORY — DX: Nocturia: R35.1

## 2013-02-06 HISTORY — DX: Pneumonia, unspecified organism: J18.9

## 2013-02-06 HISTORY — DX: Nasal congestion: R09.81

## 2013-02-06 HISTORY — DX: Pain in unspecified joint: M25.50

## 2013-02-06 LAB — BASIC METABOLIC PANEL
CO2: 26 mEq/L (ref 19–32)
GFR calc non Af Amer: 80 mL/min — ABNORMAL LOW (ref >90)
Glucose, Bld: 133 mg/dL — ABNORMAL HIGH (ref 70–99)
Potassium: 3.8 mEq/L (ref 3.5–5.1)
Sodium: 140 mEq/L (ref 135–145)

## 2013-02-06 LAB — CBC
Hemoglobin: 12.3 g/dL (ref 12.0–15.0)
MCH: 29.3 pg (ref 26.0–34.0)
RBC: 4.2 MIL/uL (ref 3.87–5.11)

## 2013-02-06 MED ORDER — CEFAZOLIN SODIUM-DEXTROSE 2-3 GM-% IV SOLR
2.0000 g | INTRAVENOUS | Status: AC
  Administered 2013-02-07: 2 g via INTRAVENOUS
  Filled 2013-02-06: qty 50

## 2013-02-06 NOTE — Progress Notes (Signed)
Pt doesn't have a cardiologist  Echo in [facility] in [place]  Stress test done >76yrs ago done d/t job requirement  Denies ever having a heart cath  Dr.Richard Jacky Kindle is Medical Md  Denies ekg or cxr in past yr

## 2013-02-07 ENCOUNTER — Encounter (HOSPITAL_COMMUNITY)
Admission: RE | Admit: 2013-02-07 | Discharge: 2013-02-07 | Disposition: A | Discharge status: Home / Self Care | Payer: BC Managed Care – PPO | Source: Ambulatory Visit | Attending: Surgery | Admitting: Surgery

## 2013-02-07 ENCOUNTER — Encounter (HOSPITAL_COMMUNITY): Payer: Self-pay | Admitting: Vascular Surgery

## 2013-02-07 ENCOUNTER — Other Ambulatory Visit: Payer: Self-pay | Admitting: Oncology

## 2013-02-07 ENCOUNTER — Encounter (HOSPITAL_COMMUNITY): Payer: Self-pay | Admitting: *Deleted

## 2013-02-07 ENCOUNTER — Ambulatory Visit
Admission: RE | Admit: 2013-02-07 | Discharge: 2013-02-07 | Disposition: A | Discharge status: Home / Self Care | Payer: BC Managed Care – PPO | Source: Ambulatory Visit | Attending: Surgery | Admitting: Surgery

## 2013-02-07 ENCOUNTER — Encounter (HOSPITAL_COMMUNITY)
Admission: RE | Disposition: A | Discharge status: Home / Self Care | Payer: Self-pay | Source: Ambulatory Visit | Attending: Surgery

## 2013-02-07 ENCOUNTER — Ambulatory Visit (HOSPITAL_COMMUNITY)
Admission: RE | Admit: 2013-02-07 | Discharge: 2013-02-07 | Disposition: A | Discharge status: Home / Self Care | Payer: BC Managed Care – PPO | Source: Ambulatory Visit | Attending: Surgery | Admitting: Surgery

## 2013-02-07 ENCOUNTER — Ambulatory Visit (HOSPITAL_COMMUNITY): Payer: BC Managed Care – PPO | Admitting: Anesthesiology

## 2013-02-07 DIAGNOSIS — Y838 Other surgical procedures as the cause of abnormal reaction of the patient, or of later complication, without mention of misadventure at the time of the procedure: Secondary | ICD-10-CM | POA: Insufficient documentation

## 2013-02-07 DIAGNOSIS — Z170 Estrogen receptor positive status [ER+]: Secondary | ICD-10-CM | POA: Insufficient documentation

## 2013-02-07 DIAGNOSIS — Z86711 Personal history of pulmonary embolism: Secondary | ICD-10-CM | POA: Insufficient documentation

## 2013-02-07 DIAGNOSIS — Z803 Family history of malignant neoplasm of breast: Secondary | ICD-10-CM | POA: Insufficient documentation

## 2013-02-07 DIAGNOSIS — Z79899 Other long term (current) drug therapy: Secondary | ICD-10-CM | POA: Insufficient documentation

## 2013-02-07 DIAGNOSIS — Z885 Allergy status to narcotic agent status: Secondary | ICD-10-CM | POA: Insufficient documentation

## 2013-02-07 DIAGNOSIS — F329 Major depressive disorder, single episode, unspecified: Secondary | ICD-10-CM | POA: Insufficient documentation

## 2013-02-07 DIAGNOSIS — C50919 Malignant neoplasm of unspecified site of unspecified female breast: Secondary | ICD-10-CM | POA: Insufficient documentation

## 2013-02-07 DIAGNOSIS — D0590 Unspecified type of carcinoma in situ of unspecified breast: Secondary | ICD-10-CM

## 2013-02-07 DIAGNOSIS — Z7901 Long term (current) use of anticoagulants: Secondary | ICD-10-CM | POA: Insufficient documentation

## 2013-02-07 DIAGNOSIS — C50911 Malignant neoplasm of unspecified site of right female breast: Secondary | ICD-10-CM

## 2013-02-07 DIAGNOSIS — E669 Obesity, unspecified: Secondary | ICD-10-CM

## 2013-02-07 DIAGNOSIS — N641 Fat necrosis of breast: Secondary | ICD-10-CM | POA: Insufficient documentation

## 2013-02-07 DIAGNOSIS — N6039 Fibrosclerosis of unspecified breast: Secondary | ICD-10-CM | POA: Insufficient documentation

## 2013-02-07 DIAGNOSIS — IMO0002 Reserved for concepts with insufficient information to code with codable children: Secondary | ICD-10-CM | POA: Insufficient documentation

## 2013-02-07 DIAGNOSIS — F3289 Other specified depressive episodes: Secondary | ICD-10-CM | POA: Insufficient documentation

## 2013-02-07 HISTORY — PX: BREAST LUMPECTOMY: SHX2

## 2013-02-07 HISTORY — PX: BREAST LUMPECTOMY WITH NEEDLE LOCALIZATION AND AXILLARY SENTINEL LYMPH NODE BX: SHX5760

## 2013-02-07 SURGERY — BREAST LUMPECTOMY WITH NEEDLE LOCALIZATION AND AXILLARY SENTINEL LYMPH NODE BX
Anesthesia: General | Site: Breast | Laterality: Right | Wound class: Clean

## 2013-02-07 MED ORDER — METHYLENE BLUE 1 % INJ SOLN
INTRAMUSCULAR | Status: AC
  Filled 2013-02-07: qty 10

## 2013-02-07 MED ORDER — LIDOCAINE HCL (CARDIAC) 20 MG/ML IV SOLN
INTRAVENOUS | Status: DC | PRN
  Administered 2013-02-07: 60 mg via INTRAVENOUS

## 2013-02-07 MED ORDER — ONDANSETRON HCL 4 MG/2ML IJ SOLN
INTRAMUSCULAR | Status: DC | PRN
  Administered 2013-02-07: 4 mg via INTRAVENOUS

## 2013-02-07 MED ORDER — SODIUM CHLORIDE 0.9 % IJ SOLN
INTRAMUSCULAR | Status: DC | PRN
  Administered 2013-02-07: 11:00:00 via INTRAMUSCULAR

## 2013-02-07 MED ORDER — TECHNETIUM TC 99M SULFUR COLLOID FILTERED
1.0000 | Freq: Once | INTRAVENOUS | Status: AC | PRN
  Administered 2013-02-07: 1 via INTRADERMAL

## 2013-02-07 MED ORDER — LACTATED RINGERS IV SOLN
INTRAVENOUS | Status: DC
  Administered 2013-02-07: 11:00:00 via INTRAVENOUS

## 2013-02-07 MED ORDER — HYDROMORPHONE HCL 4 MG PO TABS: 4.0000 mg | ORAL_TABLET | ORAL | Status: DC | PRN

## 2013-02-07 MED ORDER — MIDAZOLAM HCL 5 MG/5ML IJ SOLN
INTRAMUSCULAR | Status: DC | PRN
  Administered 2013-02-07: 2 mg via INTRAVENOUS

## 2013-02-07 MED ORDER — BUPIVACAINE-EPINEPHRINE 0.25% -1:200000 IJ SOLN
INTRAMUSCULAR | Status: DC | PRN
  Administered 2013-02-07: 20 mL

## 2013-02-07 MED ORDER — BUPIVACAINE-EPINEPHRINE PF 0.25-1:200000 % IJ SOLN
INTRAMUSCULAR | Status: AC
  Filled 2013-02-07: qty 30

## 2013-02-07 MED ORDER — 0.9 % SODIUM CHLORIDE (POUR BTL) OPTIME
TOPICAL | Status: DC | PRN
  Administered 2013-02-07: 1000 mL

## 2013-02-07 MED ORDER — PROPOFOL 10 MG/ML IV BOLUS
INTRAVENOUS | Status: DC | PRN
  Administered 2013-02-07: 200 mg via INTRAVENOUS

## 2013-02-07 MED ORDER — FENTANYL CITRATE 0.05 MG/ML IJ SOLN: 25.0000 ug | INTRAMUSCULAR | Status: DC | PRN

## 2013-02-07 MED ORDER — FENTANYL CITRATE 0.05 MG/ML IJ SOLN
INTRAMUSCULAR | Status: DC | PRN
  Administered 2013-02-07: 50 ug via INTRAVENOUS
  Administered 2013-02-07: 100 ug via INTRAVENOUS
  Administered 2013-02-07: 50 ug via INTRAVENOUS
  Administered 2013-02-07 (×3): 25 ug via INTRAVENOUS
  Administered 2013-02-07: 50 ug via INTRAVENOUS
  Administered 2013-02-07: 25 ug via INTRAVENOUS

## 2013-02-07 SURGICAL SUPPLY — 55 items
APL SKNCLS STERI-STRIP NONHPOA (GAUZE/BANDAGES/DRESSINGS) ×1
APPLIER CLIP 9.375 MED OPEN (MISCELLANEOUS)
APR CLP MED 9.3 20 MLT OPN (MISCELLANEOUS)
BENZOIN TINCTURE PRP APPL 2/3 (GAUZE/BANDAGES/DRESSINGS) ×2 IMPLANT
BINDER BREAST LRG (GAUZE/BANDAGES/DRESSINGS) IMPLANT
BINDER BREAST XLRG (GAUZE/BANDAGES/DRESSINGS) IMPLANT
BLADE SURG 15 STRL LF DISP TIS (BLADE) ×1 IMPLANT
BLADE SURG 15 STRL SS (BLADE) ×2
CANISTER SUCTION 2500CC (MISCELLANEOUS) ×2 IMPLANT
CHLORAPREP W/TINT 26ML (MISCELLANEOUS) ×2 IMPLANT
CLIP APPLIE 9.375 MED OPEN (MISCELLANEOUS) IMPLANT
CLOTH BEACON ORANGE TIMEOUT ST (SAFETY) ×2 IMPLANT
CLSR STERI-STRIP ANTIMIC 1/2X4 (GAUZE/BANDAGES/DRESSINGS) ×1 IMPLANT
CONT SPEC 4OZ CLIKSEAL STRL BL (MISCELLANEOUS) ×2 IMPLANT
COVER PROBE W GEL 5X96 (DRAPES) ×2 IMPLANT
COVER SURGICAL LIGHT HANDLE (MISCELLANEOUS) ×2 IMPLANT
DEVICE DUBIN SPECIMEN MAMMOGRA (MISCELLANEOUS) ×1 IMPLANT
DRAPE LAPAROSCOPIC ABDOMINAL (DRAPES) ×2 IMPLANT
DRSG TEGADERM 4X4.75 (GAUZE/BANDAGES/DRESSINGS) ×1 IMPLANT
ELECT CAUTERY BLADE 6.4 (BLADE) ×2 IMPLANT
ELECT REM PT RETURN 9FT ADLT (ELECTROSURGICAL) ×2
ELECTRODE REM PT RTRN 9FT ADLT (ELECTROSURGICAL) ×1 IMPLANT
GLOVE BIO SURGEON STRL SZ7.5 (GLOVE) ×2 IMPLANT
GLOVE BIOGEL PI IND STRL 7.5 (GLOVE) IMPLANT
GLOVE BIOGEL PI INDICATOR 7.5 (GLOVE) ×2
GLOVE ECLIPSE 6.5 STRL STRAW (GLOVE) ×1 IMPLANT
GLOVE SURG SIGNA 7.5 PF LTX (GLOVE) ×2 IMPLANT
GLOVE SURG SS PI 6.5 STRL IVOR (GLOVE) ×1 IMPLANT
GOWN PREVENTION PLUS XLARGE (GOWN DISPOSABLE) ×2 IMPLANT
GOWN STRL NON-REIN LRG LVL3 (GOWN DISPOSABLE) ×4 IMPLANT
KIT BASIN OR (CUSTOM PROCEDURE TRAY) ×2 IMPLANT
KIT MARKER MARGIN INK (KITS) ×2 IMPLANT
KIT ROOM TURNOVER OR (KITS) ×2 IMPLANT
NDL 18GX1X1/2 (RX/OR ONLY) (NEEDLE) ×1 IMPLANT
NDL HYPO 25GX1X1/2 BEV (NEEDLE) ×2 IMPLANT
NEEDLE 18GX1X1/2 (RX/OR ONLY) (NEEDLE) ×2 IMPLANT
NEEDLE HYPO 25GX1X1/2 BEV (NEEDLE) ×4 IMPLANT
NS IRRIG 1000ML POUR BTL (IV SOLUTION) ×2 IMPLANT
PACK SURGICAL SETUP 50X90 (CUSTOM PROCEDURE TRAY) ×2 IMPLANT
PAD ARMBOARD 7.5X6 YLW CONV (MISCELLANEOUS) ×2 IMPLANT
PENCIL BUTTON HOLSTER BLD 10FT (ELECTRODE) ×2 IMPLANT
SPONGE GAUZE 4X4 12PLY (GAUZE/BANDAGES/DRESSINGS) ×2 IMPLANT
SPONGE LAP 18X18 X RAY DECT (DISPOSABLE) ×1 IMPLANT
SPONGE LAP 4X18 X RAY DECT (DISPOSABLE) ×2 IMPLANT
STRIP CLOSURE SKIN 1/2X4 (GAUZE/BANDAGES/DRESSINGS) ×2 IMPLANT
SUT MON AB 4-0 PC3 18 (SUTURE) ×2 IMPLANT
SUT VIC AB 3-0 SH 27 (SUTURE) ×2
SUT VIC AB 3-0 SH 27XBRD (SUTURE) ×1 IMPLANT
SYR BULB 3OZ (MISCELLANEOUS) ×2 IMPLANT
SYR CONTROL 10ML LL (SYRINGE) ×4 IMPLANT
TAPE CLOTH SURG 4X10 WHT LF (GAUZE/BANDAGES/DRESSINGS) ×1 IMPLANT
TOWEL OR 17X24 6PK STRL BLUE (TOWEL DISPOSABLE) ×2 IMPLANT
TOWEL OR 17X26 10 PK STRL BLUE (TOWEL DISPOSABLE) ×2 IMPLANT
TUBE CONNECTING 12X1/4 (SUCTIONS) ×2 IMPLANT
YANKAUER SUCT BULB TIP NO VENT (SUCTIONS) ×2 IMPLANT

## 2013-02-07 NOTE — Interval H&P Note (Signed)
History and Physical Interval Note: no change in H and P  02/07/2013 9:10 AM  Jean [name]  has presented today for surgery, with the diagnosis of right breast cancer  The various methods of treatment have been discussed with the patient and family. After consideration of risks, benefits and other options for treatment, the patient has consented to  Procedure(s): BREAST LUMPECTOMY WITH NEEDLE LOCALIZATION AND AXILLARY SENTINEL LYMPH NODE BX (Right) as a surgical intervention .  The patient's history has been reviewed, patient examined, no change in status, stable for surgery.  I have reviewed the patient's chart and labs.  Questions were answered to the patient's satisfaction.     Alize Borrayo A

## 2013-02-07 NOTE — Progress Notes (Signed)
Patient initially got nauseated or arrival. Patient reports history of motion sickness. Patient tolerating PO fluids without nausea will d/c home

## 2013-02-07 NOTE — H&P (Signed)
Patient ID: Jean [name], female DOB: 04/27/1961, 52 y.o. MRN: [id number]  Chief Complaint   Patient presents with   .  Other     right breast cancer   HPI  Jean [name] is a 52 y.o. female.  HPI  This is a very pleasant female referred by Dr. Geoffry Paradise after the recent diagnosis of a right breast cancer. A lesion was found on screening mammography. Biopsy confirmed invasive cancer. She has no previous problems with her breast. She denies nipple discharge. She is otherwise without complaints.  Past Medical History   Diagnosis  Date   .  Vertigo    .  Depression    .  Pulmonary embolism    .  Obesity    .  Breast cancer     Past Surgical History   Procedure  Laterality  Date   .  Adenoidectomy   1967    Family History   Problem  Relation  Age of Onset   .  Breast cancer  Maternal Aunt    .  Breast cancer  Maternal Grandmother    .  Breast cancer  Cousin    Social History  History   Substance Use Topics   .  Smoking status:  Former Games developer   .  Smokeless tobacco:  Never Used      Comment: only smoked for a year   .  Alcohol Use:  No    Allergies   Allergen  Reactions   .  Codeine  Shortness Of Breath    Current Outpatient Prescriptions   Medication  Sig  Dispense  Refill   .  ARIPiprazole (ABILIFY) 5 MG tablet  Take 5 mg by mouth daily.     Marland Kitchen  atorvastatin (LIPITOR) 20 MG tablet  Take 20 mg by mouth daily.     .  diazepam (VALIUM) 2 MG tablet  Take 1 tablet (2 mg total) by mouth 2 (two) times daily with breakfast and lunch.  60 tablet  5   .  pseudoephedrine (SUDAFED) 30 MG tablet  Take 30 mg by mouth every 4 (four) hours as needed for congestion.     .  Rivaroxaban (XARELTO PO)  Take by mouth.     .  Vilazodone HCl (VIIBRYD) 40 MG TABS  Take by mouth daily.      No current facility-administered medications for this visit.   Review of Systems  Review of Systems  Constitutional: Negative for fever, chills and unexpected weight change.  HENT: Negative for  hearing loss, congestion, sore throat, trouble swallowing and voice change.  Eyes: Negative for visual disturbance.  Respiratory: Negative for cough and wheezing.  Cardiovascular: Negative for chest pain, palpitations and leg swelling.  Gastrointestinal: Negative for nausea, vomiting, abdominal pain, diarrhea, constipation, blood in stool, abdominal distention and anal bleeding.  Genitourinary: Negative for hematuria, vaginal bleeding and difficulty urinating.  Musculoskeletal: Negative for arthralgias.  Skin: Negative for rash and wound.  Neurological: Negative for seizures, syncope and headaches.  Hematological: Negative for adenopathy. Does not bruise/bleed easily.  Psychiatric/Behavioral: Negative for confusion.  Breast: There is a large hematoma with ecchymosis at the 12:00 position of the right breast. There are no other palpable masses negative breast.  There were no vitals taken for this visit.  Physical Exam  Physical Exam  Constitutional: She is oriented to person, place, and time. She appears well-developed.  obese  HENT:  Head: Normocephalic and atraumatic.  Right Ear: External ear normal.  Left  Ear: External ear normal.  Nose: Nose normal.  Mouth/Throat: Oropharynx is clear and moist. No oropharyngeal exudate.  Eyes: Conjunctivae are normal. Pupils are equal, round, and reactive to light. Right eye exhibits no discharge. Left eye exhibits no discharge. No scleral icterus.  Neck: Normal range of motion. Neck supple. No tracheal deviation present. No thyromegaly present.  Cardiovascular: Normal rate, regular rhythm and normal heart sounds.  Pulmonary/Chest: Effort normal and breath sounds normal. No respiratory distress. She has no wheezes.  Abdominal: Soft. Bowel sounds are normal. She exhibits no distension. There is no tenderness. There is no rebound.  Musculoskeletal: Normal range of motion. She exhibits no edema and no tenderness.  Lymphadenopathy:  She has no cervical  adenopathy.  She has no axillary adenopathy.  Neurological: She is alert and oriented to person, place, and time.  Skin: Skin is warm and dry. No rash noted. No erythema.  Psychiatric: Her behavior is normal. Judgment normal.  Data Reviewed  Mammograms demonstrate the 1 cm mass inferior to the nipple on the right breast. No masses seen on ultrasound. MRI shows the biopsy area which is less than a centimeter in size with a large hematoma and some nodular enhancement adjacent. Pathology shows and ER/PR positive invasive breast cancer  Assessment  right breast cancer  Plan  After discussion in the multidisciplinary breast conference, lumpectomy and sentinel nerve biopsy is recommended. I discussed this with the patient and her family in detail. I did discuss lumpectomy versus mastectomy. She wishes to proceed with breast conservation. She understands that if the margins are positive, she may need removal of the nipple and areolar complex and she is very comfortable with that. I discussed the risks of surgery which includes but not limited to bleeding, infection, injury to stranding structures, need for further surgery, et Karie Soda. She will stop her anticoagulation medicines preoperatively. Surgery will be scheduled.

## 2013-02-07 NOTE — Anesthesia Preprocedure Evaluation (Addendum)
Anesthesia Evaluation  Patient identified by MRN, date of birth, ID band Patient awake    Reviewed: Allergy & Precautions, H&P , NPO status , Patient's Chart, lab work & pertinent test results  Airway Mallampati: II      Dental  (+) Teeth Intact   Pulmonary asthma , pneumonia -, resolved, COPD COPD inhaler,  breath sounds clear to auscultation        Cardiovascular negative cardio ROS  Rhythm:Regular Rate:Normal     Neuro/Psych Anxiety    GI/Hepatic Neg liver ROS, PUD, GERD-  Medicated and Controlled,  Endo/Other  negative endocrine ROS  Renal/GU negative Renal ROS     Musculoskeletal   Abdominal   Peds  Hematology   Anesthesia Other Findings   Reproductive/Obstetrics                         Anesthesia Physical Anesthesia Plan  ASA: III  Anesthesia Plan: General   Post-op Pain Management:    Induction: Intravenous  Airway Management Planned: Oral ETT  Additional Equipment:   Intra-op Plan:   Post-operative Plan: Extubation in OR  Informed Consent: I have reviewed the patients History and Physical, chart, labs and discussed the procedure including the risks, benefits and alternatives for the proposed anesthesia with the patient or authorized representative who has indicated his/her understanding and acceptance.   Dental advisory given  Plan Discussed with: CRNA, Anesthesiologist and Surgeon  Anesthesia Plan Comments:         Anesthesia Quick Evaluation

## 2013-02-07 NOTE — Preoperative (Signed)
Beta Blockers   Reason not to administer Beta Blockers:Not Applicable 

## 2013-02-07 NOTE — Anesthesia Postprocedure Evaluation (Signed)
  Anesthesia Post-op Note  Patient: Jean [name]  Procedure(s) Performed: Procedure(s): BREAST LUMPECTOMY WITH NEEDLE LOCALIZATION AND AXILLARY SENTINEL LYMPH NODE BX (Right)  Patient Location: PACU  Anesthesia Type:General  Level of Consciousness: awake  Airway and Oxygen Therapy: Patient Spontanous Breathing  Post-op Pain: mild  Post-op Assessment: Post-op Vital signs reviewed  Post-op Vital Signs: Reviewed  Complications: No apparent anesthesia complications

## 2013-02-07 NOTE — Anesthesia Procedure Notes (Signed)
Procedure Name: LMA Insertion Date/Time: 02/07/2013 11:14 AM Performed by: Sharlene Dory E Pre-anesthesia Checklist: Patient identified, Emergency Drugs available, Suction available, Patient being monitored and Timeout performed Patient Re-evaluated:Patient Re-evaluated prior to inductionOxygen Delivery Method: Circle system utilized Preoxygenation: Pre-oxygenation with 100% oxygen Intubation Type: IV induction LMA: LMA inserted LMA Size: 4.0 Number of attempts: 1 Placement Confirmation: positive ETCO2 and breath sounds checked- equal and bilateral Tube secured with: Tape Dental Injury: Teeth and Oropharynx as per pre-operative assessment

## 2013-02-07 NOTE — Transfer of Care (Signed)
Immediate Anesthesia Transfer of Care Note  Patient: Jean [name]  Procedure(s) Performed: Procedure(s): BREAST LUMPECTOMY WITH NEEDLE LOCALIZATION AND AXILLARY SENTINEL LYMPH NODE BX (Right)  Patient Location: PACU  Anesthesia Type:General  Level of Consciousness: awake, alert  and oriented  Airway & Oxygen Therapy: Patient Spontanous Breathing and Patient connected to nasal cannula oxygen  Post-op Assessment: Report given to PACU RN and Post -op Vital signs reviewed and stable  Post vital signs: Reviewed and stable  Complications: No apparent anesthesia complications

## 2013-02-07 NOTE — Op Note (Signed)
BREAST LUMPECTOMY WITH NEEDLE LOCALIZATION AND AXILLARY SENTINEL LYMPH NODE BX  Procedure Note  Jean [name] 02/07/2013   Pre-op Diagnosis: right breast cancer     Post-op Diagnosis: same  Procedure(s): Right BREAST LUMPECTOMY WITH NEEDLE LOCALIZATION AND AXILLARY SENTINEL LYMPH NODE BX  Surgeon(s): Shelly Rubenstein, MD  Anesthesia: General  Staff:  Circulator: Ezzard Flax, RN; Gerre Pebbles Sipsis, RN Relief Circulator: Maureen Ralphs, RN Scrub Person: Janeece Agee Pingue, CST  Estimated Blood Loss: Minimal               Specimens: nodes x 3 sent to path          [facility]   Date: 02/07/2013  Time: 12:26 PM

## 2013-02-08 ENCOUNTER — Telehealth (INDEPENDENT_AMBULATORY_CARE_PROVIDER_SITE_OTHER): Payer: Self-pay

## 2013-02-08 ENCOUNTER — Other Ambulatory Visit (INDEPENDENT_AMBULATORY_CARE_PROVIDER_SITE_OTHER): Payer: Self-pay

## 2013-02-08 DIAGNOSIS — R112 Nausea with vomiting, unspecified: Secondary | ICD-10-CM

## 2013-02-08 MED ORDER — PROMETHAZINE HCL 25 MG RE SUPP: 25.0000 mg | Freq: Four times a day (QID) | RECTAL | Status: DC | PRN

## 2013-02-08 NOTE — Telephone Encounter (Signed)
Patient states she has extreme nausea can not keep water down. Phenergan 25 mg suppositories one PR  Q8 hrs PRN/ Nausea called to [facility] [place] [phone]  Advised her to call if not better

## 2013-02-08 NOTE — Op Note (Signed)
NAME:  Jean [name], Jean [name] NO.:  [id number]  MEDICAL RECORD NO.:  [id number]  LOCATION:  [facility]                          FACILITY:  [facility]  PHYSICIAN:  Abigail Miyamoto, M.D. DATE OF BIRTH:  1961-06-10  DATE OF PROCEDURE:  02/07/2013 DATE OF DISCHARGE:                              OPERATIVE REPORT   PREOPERATIVE DIAGNOSIS:  Right breast cancer.  POSTOPERATIVE DIAGNOSIS:  Right breast cancer.  PROCEDURE: 1. Right breast needle localized lumpectomy/partial mastectomy and     right sentinel lymph node biopsy. 2. Injection of blue dye.  SURGEON:  Abigail Miyamoto, MD  ANESTHESIA:  General endotracheal anesthesia and 0.5% Marcaine.  ESTIMATED BLOOD LOSS:  Minimal.  INDICATIONS:  This is a 52 year old female, who underwent a stereotactic biopsy of a suspicious area in the right breast.  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  This did create a large hematoma in the breast.  Decision was made to proceed with a needle localized partial mastectomy and sentinel lymph node biopsy.  FINDINGS:  The patient was found to have 3 sentinel lymph nodes identified with sentinel node biopsy.  The suspicious area was removed and confirmed with x-ray.  PROCEDURE IN DETAIL:  The patient was identified in the holding area and radioactive isotope was injected into the right breast.  She was then taken in a stable condition to the operating room.  She again was identified as correct patient, placed supine on the operating table and general anesthesia was induced.  I then injected blue dye underneath the nipple and areola of the right breast and massaged the breast.  Her right breast and chest were then prepped and draped in usual sterile fashion.  I made elliptical incision around localization wire at the 12 o'clock position of the breast.  I took this down to the breast tissue with the electrocautery.  The patient had a very large hematoma from the initial biopsy.  I  excised this in its entirety doing a very wide partial mastectomy achieving wide margins around the specimen.  I did take this all the way down to the chest wall.  Once the specimen was completely removed, x-ray confirmed the suspicious area was centered in the lesion.  I then marked the specimen with marker paint and sent to Pathology for evaluation.  Next, I brought a Neoprobe onto the field.  I identified an area of increased uptake in the right axilla.  I anesthetized skin with Marcaine and made a small incision with a scalpel.  I then took this down to the axillary tissue with electrocautery.  Three sentinel lymph nodes were identified with the Neoprobe.  The first node was removed and the largest of it was hot and blue.  The 2 lymph nodes that were identified were hot only with no blue dye and all 3 were sent to Pathology for evaluation.  I achieved hemostasis in the axilla with cautery.  I then irrigated both wounds with normal saline.  I anesthetized them further with Marcaine.  I placed surgical clips around the biopsy cavity at the 12 o'clock position of the breast.  I then closed both incisions with interrupted 3-0 Vicryl sutures and closed both skin incisions  with running 4-0 Monocryl.  Steri-Strips, gauze, and tape were then applied. The patient tolerated the procedure well.  All counts were correct at the end of procedure.  The patient was then extubated in the operating room and taken in stable condition to recovery room.     Abigail Miyamoto, M.D.     DB/MEDQ  D:  02/07/2013  T:  02/08/2013  Job:  [id number]

## 2013-02-11 ENCOUNTER — Ambulatory Visit (INDEPENDENT_AMBULATORY_CARE_PROVIDER_SITE_OTHER): Payer: BC Managed Care – PPO | Admitting: Neurology

## 2013-02-11 ENCOUNTER — Encounter: Payer: Self-pay | Admitting: Neurology

## 2013-02-11 VITALS — BP 110/70 | HR 62 | Temp 97.8°F | Resp 12 | Ht 64.0 in | Wt 251.0 lb

## 2013-02-11 DIAGNOSIS — H8110 Benign paroxysmal vertigo, unspecified ear: Secondary | ICD-10-CM

## 2013-02-11 NOTE — Progress Notes (Signed)
Jean [name] returns for followup of her vertigo.  The forward rushing motion sensation did resolve with adducting her vertigo exercises to include backwards and forward movements.  She has not needed diazepam.  However, she has been diagnosed with breast cancer and is waiting the results of her lumpectomy and node dissection.  She is now experiencing some vertigo when laying down with the left ear down, or more so when getting up from a lying position and her left ear is down.  Although she did lose her job, she still has a short-term disability.  Given the ongoing vertigo as well as the breast cancer, I feel it is reasonable to extend her disability for additional 2 months term control he has cancer issues and treatment outlined has been defined.  Further, she can resume her vertigo exercises which she had stopped due to the breast cancer biopsies and treatments.  Hopefully this will result in better control the vertigo going forward.  Exercises will be the preferred treatment. If she continues to have chronic problems not resolve with exercises, we could consider resuming the diazepam 2 mg up to twice a day as needed.  Review of systems is positive for local surgical pain as well as some fogginess postanesthesia and some occasional indigestion. Remainder of review of systems is unremarkable.  Past Medical History  Diagnosis Date  . Vertigo     pt doesn't take any medication  . Pulmonary embolism 2012    takes Xarelto daily  . Obesity   . Breast cancer   . Hyperlipidemia     takes Lipitor daily  . PUD (peptic ulcer disease)     remote  . COPD with asthma   . Nasal congestion     takes Sudafed if needed)  . Depression     takes Viibryd daily as well as Abilify  . Pneumonia     hx of;last time around 1987  . Arthritis   . Joint pain   . GERD (gastroesophageal reflux disease)     related to Xarelto;takes Omeprazole daily  . Stress incontinence   . Nocturia     Current Outpatient  Prescriptions on File Prior to Visit  Medication Sig Dispense Refill  . ARIPiprazole (ABILIFY) 5 MG tablet Take 5 mg by mouth daily. 2.5 mg daily      . atorvastatin (LIPITOR) 20 MG tablet Take 20 mg by mouth daily.      . celecoxib (CELEBREX) 200 MG capsule Take 200 mg by mouth 2 (two) times daily.      Marland Kitchen HYDROmorphone (DILAUDID) 4 MG tablet Take 1 tablet (4 mg total) by mouth every 4 (four) hours as needed for pain.  40 tablet  0  . omeprazole (PRILOSEC) 20 MG capsule Take 20 mg by mouth daily.      . promethazine (PHENERGAN) 25 MG suppository Place 1 suppository (25 mg total) rectally every 6 (six) hours as needed for nausea.  12 each  0  . pseudoephedrine (SUDAFED) 30 MG tablet Take 30 mg by mouth every 4 (four) hours as needed for congestion.      . Vilazodone HCl (VIIBRYD) 40 MG TABS Take by mouth daily.      . Rivaroxaban (XARELTO PO) Take 20 mg by mouth daily.        No current facility-administered medications on file prior to visit.   Codeine  History   Social History  . Marital Status: Single    Spouse Name: N/A    Number of Children:  N/A  . Years of Education: N/A   Occupational History  . Not on file.   Social History Main Topics  . Smoking status: Never Smoker   . Smokeless tobacco: Never Used  . Alcohol Use: No  . Drug Use: No  . Sexually Active: No   Other Topics Concern  . Not on file   Social History Narrative  . No narrative on file    Family History  Problem Relation Age of Onset  . Adopted: Yes  . Breast cancer Maternal Aunt   . Breast cancer Maternal Grandmother   . Breast cancer Cousin    BP 110/70  Pulse 62  Temp(Src) 97.8 F (36.6 C)  Resp 12  Ht 5\' 4"  (1.626 m)  Wt 251 lb (113.853 kg)  BMI 43.06 kg/m2  Alert and oriented x 3.  Memory function appears to be intact.  Concentration and attention are normal for educational level and background.  Speech is fluent and without significant word finding difficulty.  Is aware of current  events.  No carotid bruits detected.  Hallpike maneuvers produces some vertigo from going to lying to sitting more so when the left ear was down, with chest couple square waves or one or 2 beats of nystagmus only, but no sustained nystagmus.  Cranial nerve II through XII are within normal limits.  This includes normal optic discs and acuity, EOMI, PERLA, facial movement and sensation intact, hearing grossly intact, gag intact,Uvula raises symmetrically and tongue protrudes evenly. Motor strength is 5 over 5 throughout all limbs.  No atrophy, abnormal tone or tremors. Reflexes are 2+ and symmetric in the upper and lower extremities Sensory exam is intact. Coordination is intact for fine movements and rapid alternating movements in all limbs Gait and station are normal.   Impression: Positional vertigo, improved with exercises.  However she had recurrence of the vertigo after having to stop her exercises due to the breast procedures. Recently diagnosed breast cancer awaiting results of lumpectomy and node dissection and will most likely need at least radiation therapy, if not chemotherapy.  Plan: She will try to resume her exercises which have been helpful in the past for her vertigo condition. If she continues a chronic vertigo not responded exercises, she could resume usage of diazepam 2 mg twice a day if needed. Due to the combination of the vertigo and the recent diagnosis of breast cancer that will likely need radiation therapy treatments, I will extend her short-term disability for an additional 2 months.

## 2013-02-11 NOTE — Patient Instructions (Addendum)
Follow up in our office in 2 months.   

## 2013-02-12 ENCOUNTER — Encounter (HOSPITAL_COMMUNITY): Payer: Self-pay | Admitting: Surgery

## 2013-02-14 ENCOUNTER — Encounter: Payer: Self-pay | Admitting: *Deleted

## 2013-02-14 NOTE — Progress Notes (Signed)
Faxed oncotype dx information to pathology and [employer].  Spoke to Tammy in pathology to inform her of the oncotype paperwork being sent over.

## 2013-02-18 ENCOUNTER — Ambulatory Visit (INDEPENDENT_AMBULATORY_CARE_PROVIDER_SITE_OTHER): Payer: BC Managed Care – PPO | Admitting: Surgery

## 2013-02-18 ENCOUNTER — Encounter (INDEPENDENT_AMBULATORY_CARE_PROVIDER_SITE_OTHER): Payer: Self-pay | Admitting: Surgery

## 2013-02-18 VITALS — BP 122/78 | HR 82 | Resp 14 | Ht 64.0 in | Wt 247.6 lb

## 2013-02-18 DIAGNOSIS — Z09 Encounter for follow-up examination after completed treatment for conditions other than malignant neoplasm: Secondary | ICD-10-CM

## 2013-02-18 NOTE — Progress Notes (Signed)
Subjective:     Patient ID: Jean [name], female   DOB: 02-22-1961, 52 y.o.   MRN: [id number]  HPI She is here for postop visit. She is doing well and has no complaints. On exam there is a slight seroma in the right axilla.  Review of Systems     Objective:   Physical Exam I drained stroma with an 18-gauge needle getting out approximately 10 cc of fluid. The breast incision itself is healing well.  The final pathology showed all margins were negative. Also the lymph nodes were negative as well    Assessment:     Patient stable postop     Plan:     She may resume normal activity. I will see her back in 3 months.

## 2013-02-21 ENCOUNTER — Ambulatory Visit: Payer: BC Managed Care – PPO | Admitting: Dietician

## 2013-02-25 ENCOUNTER — Encounter: Payer: Self-pay | Admitting: *Deleted

## 2013-02-25 ENCOUNTER — Other Ambulatory Visit: Payer: Self-pay | Admitting: Oncology

## 2013-02-25 ENCOUNTER — Telehealth: Payer: Self-pay | Admitting: *Deleted

## 2013-02-25 NOTE — Progress Notes (Signed)
Received Oncotype Dx results of 11.  Emailed MD w/ results.  Gave MD copy.  Took copy to Med Rec to scan.

## 2013-02-25 NOTE — Telephone Encounter (Signed)
sw pt gv appt for 03/07/13 @ 1:30pm for Dr. Dayton Scrape. Pt also canceled her appt for 03/06/13 with GCM...td

## 2013-02-26 ENCOUNTER — Telehealth: Payer: Self-pay | Admitting: *Deleted

## 2013-02-26 NOTE — Telephone Encounter (Signed)
Received a request for a genetic appt to be scheduled.  Called and confirmed 04/18/13 genetic appt w/ pt.

## 2013-03-06 ENCOUNTER — Ambulatory Visit: Payer: BC Managed Care – PPO | Admitting: Oncology

## 2013-03-06 NOTE — Progress Notes (Signed)
Location of Breast Cancer:right   Histology per Pathology Report: ductal carcinoma in situ Diagnosis 1. Breast, lumpectomy, Right - INVASIVE GRADE II DUCTAL CARCINOMA, SPANNING 1.2 CM IN GREATEST DIMENSION. 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 - EXTENSIVE BIOPSY SITE CHANGES WITH FAT NECROSIS, FIBROSIS AND HEMORRHAGE ADJACENT TO TUMOR. - DEFINITIVE LYMPH/VASCULAR INVASION IS NOT IDENTIFIED. - MARGINS ARE NEGATIVE 2. Lymph node, sentinel, biopsy, Right axillary #1 - ONE BENIGN LYMPH NODE WITH NO TUMOR SEEN (0/1). 3. Lymph node, sentinel, biopsy, Right axillary #2 - ONE BENIGN LYMPH NODE WITH NO TUMOR SEEN (0/1). 4. Lymph node, sentinel, biopsy, Right axillary #3 - ONE BENIGN LYMPH NODE WITH NO TUMOR SEEN (0/1). 5. Lymph node, sentinel, biopsy, Right axillary #4 - ONE BENIGN LYMPH NODE WITH NO TUMOR SEEN (0/1).  Receptor Status: ER(positive), PR (positive), Her2-neu (negative)  Did patient present with symptoms (if so, please note symptoms) or was this found on screening mammography?: screening mammography  Past/Anticipated interventions by surgeon, if WUJ:WJXBJ breast needle localized lumpectomy/partial mastectomy and right sentinel lymph node biopsy by Dr. Magnus Ivan on 02/07/2013  Past/Anticipated interventions by medical oncology, if any: Chemotherapy and FU w/Dr Magrinat pending path results from surgery  Lymphedema issues, if any:  no  Pain issues, if any:  Mild post op soreness, tenderness  SAFETY ISSUES:  Prior radiation? no  Pacemaker/ICD? no  Possible current pregnancy?no  Is the patient on methotrexate? no  Current Complaints / other details:  none    Eduardo Osier, RN 03/06/2013,12:09 PM

## 2013-03-07 ENCOUNTER — Ambulatory Visit
Admission: RE | Admit: 2013-03-07 | Discharge: 2013-03-07 | Disposition: A | Discharge status: Home / Self Care | Payer: BC Managed Care – PPO | Source: Ambulatory Visit | Attending: Radiation Oncology | Admitting: Radiation Oncology

## 2013-03-07 ENCOUNTER — Encounter: Payer: Self-pay | Admitting: Radiation Oncology

## 2013-03-07 VITALS — BP 122/76 | HR 82 | Temp 98.4°F | Resp 20 | Wt 253.0 lb

## 2013-03-07 DIAGNOSIS — C50211 Malignant neoplasm of upper-inner quadrant of right female breast: Secondary | ICD-10-CM

## 2013-03-07 DIAGNOSIS — C50919 Malignant neoplasm of unspecified site of unspecified female breast: Secondary | ICD-10-CM | POA: Insufficient documentation

## 2013-03-07 DIAGNOSIS — Z170 Estrogen receptor positive status [ER+]: Secondary | ICD-10-CM | POA: Insufficient documentation

## 2013-03-07 NOTE — Progress Notes (Signed)
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 returns today for review and scheduling of her radiation therapy in the management of her T1 N0 invasive ductal/DCIS of the right breast. I first saw the patient at the [facility] on 01/30/2013. She presented with a mass along the central/upper inner quadrant of the right breast which was biopsied on 01/22/2013 and found to represent invasive ductal carcinoma with DCIS. She underwent a right partial mastectomy and sentinel lymph node biopsy on 02/07/2013. She was found to have a 1.2 cm invasive ductal carcinoma along with DCIS. The closest surgical margin was 0.3 cm for both invasive disease and in situ disease. This was along the anterior margin. 4 lymph nodes were free of metastatic disease. Her tumor is ER positive at 100% and PR positive at 100% with a low Ki-67 of 12%. Oncotype DX score is 11 indicating an 8% risk for distant recurrence with adjuvant tamoxifen alone. She is without complaints today. She tells that she lost her job secondary to vertigo.  Physical examination: Alert and oriented. Wt Readings from Last 3 Encounters:  03/07/13 253 lb (114.76 kg)  02/18/13 247 lb 9.6 oz (112.311 kg)  02/11/13 251 lb (113.853 kg)   Temp Readings from Last 3 Encounters:  03/07/13 98.4 F (36.9 C) Oral  02/11/13 97.8 F (36.6 C)   02/07/13 98.1 F (36.7 C)    BP Readings from Last 3 Encounters:  03/07/13 122/76  02/18/13 122/78  02/11/13 110/70   Pulse Readings from Last 3 Encounters:  03/07/13 82  02/18/13 82  02/11/13 62   Head and neck examination: Grossly unremarkable. Nodes: Without palpable cervical, supraclavicular, or axillary lymphadenopathy. Chest: Lungs clear. Breasts: She is large breasted. There is a partial mastectomy wound along the upper inner quadrant of the right breast extending from 1 to 2:00. No masses are appreciated. Left breast without masses or lesions. Left breast  without masses or lesions. Abdomen without hepatomegaly. Extremities: Without edema.  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 [name] is a candidate for breast preservation. She is not a candidate for hypo-fractionated treatment because of her breast size. I will try to plan and treat her in a prone position to minimize skin toxicity. We discussed the potential acute and late toxicities of radiation therapy and she wishes to proceed as outlined. Consent is signed today. She will return for simulation/treatment planning early next week.  Plan: As discussed above.  30 minutes was spent face-to-face with the patient, primarily counseling the patient and coordinating her care.

## 2013-03-07 NOTE — Progress Notes (Signed)
Please see the Nurse Progress Note in the MD Initial Consult Encounter for this patient. 

## 2013-03-07 NOTE — Addendum Note (Signed)
Encounter addended by: Glennie Hawk, RN on: 03/07/2013  3:22 PM<BR>     Documentation filed: Charges VN

## 2013-03-12 ENCOUNTER — Encounter: Payer: Self-pay | Admitting: *Deleted

## 2013-03-12 NOTE — Progress Notes (Signed)
Mailed after appt letter to pt. 

## 2013-03-14 ENCOUNTER — Ambulatory Visit
Admission: RE | Admit: 2013-03-14 | Discharge: 2013-03-14 | Disposition: A | Discharge status: Home / Self Care | Payer: BC Managed Care – PPO | Source: Ambulatory Visit | Attending: Radiation Oncology | Admitting: Radiation Oncology

## 2013-03-14 DIAGNOSIS — C50919 Malignant neoplasm of unspecified site of unspecified female breast: Secondary | ICD-10-CM | POA: Insufficient documentation

## 2013-03-14 DIAGNOSIS — C50211 Malignant neoplasm of upper-inner quadrant of right female breast: Secondary | ICD-10-CM

## 2013-03-14 DIAGNOSIS — Z510 Encounter for antineoplastic radiation therapy: Secondary | ICD-10-CM | POA: Insufficient documentation

## 2013-03-14 NOTE — Progress Notes (Signed)
Complex simulation/treatment planning note: The patient was taken to the CT simulator. She was initially placed prone but this was too uncomfortable. She was then placed supine and we open her axillary and inframammary skin folds with gauze. We marked her field borders and also her partial mastectomy scar. She was then scanned. I chose and isocenter along the central breast. She was set up to medial and lateral right breast tangents and 2 sets of multileaf collimators were designed to conform the field. I prescribing 4500 cGy in 25 sessions followed by electron beam boost for a further 1000 cGy 5 sessions.

## 2013-03-21 ENCOUNTER — Ambulatory Visit: Payer: BC Managed Care – PPO | Admitting: Radiation Oncology

## 2013-03-22 ENCOUNTER — Ambulatory Visit
Admission: RE | Admit: 2013-03-22 | Discharge: 2013-03-22 | Disposition: A | Discharge status: Home / Self Care | Payer: BC Managed Care – PPO | Source: Ambulatory Visit | Attending: Radiation Oncology | Admitting: Radiation Oncology

## 2013-03-25 ENCOUNTER — Ambulatory Visit
Admission: RE | Admit: 2013-03-25 | Discharge: 2013-03-25 | Disposition: A | Discharge status: Home / Self Care | Payer: BC Managed Care – PPO | Source: Ambulatory Visit | Attending: Radiation Oncology | Admitting: Radiation Oncology

## 2013-03-25 ENCOUNTER — Encounter: Payer: Self-pay | Admitting: Radiation Oncology

## 2013-03-25 VITALS — BP 139/82 | HR 72 | Temp 98.7°F | Ht 64.0 in | Wt 257.6 lb

## 2013-03-25 DIAGNOSIS — C50211 Malignant neoplasm of upper-inner quadrant of right female breast: Secondary | ICD-10-CM

## 2013-03-25 MED ORDER — ALRA NON-METALLIC DEODORANT (RAD-ONC)
1.0000 "application " | Freq: Once | TOPICAL | Status: AC
  Administered 2013-03-25: 1 via TOPICAL

## 2013-03-25 MED ORDER — RADIAPLEXRX EX GEL: Freq: Once | CUTANEOUS | Status: DC

## 2013-03-25 MED ORDER — RADIAPLEXRX EX GEL
Freq: Once | CUTANEOUS | Status: AC
  Administered 2013-03-25: 17:00:00 via TOPICAL

## 2013-03-25 NOTE — Addendum Note (Signed)
Encounter addended by: Eduardo Osier, RN on: 03/25/2013  4:55 PM<BR>     Documentation filed: Inpatient MAR

## 2013-03-25 NOTE — Progress Notes (Signed)
Simulation verification note: The patient underwent simulation verification for treatment to her right breast. Her isocenter is in good position and the multileaf collimators contoured the treatment volume appropriately. 

## 2013-03-25 NOTE — Progress Notes (Signed)
Weekly Management Note:  Site: Breast Current Dose:  180  cGy Projected Dose: 4500  cGy  Narrative: The patient is seen today for routine under treatment assessment. CBCT/MVCT images/port films were reviewed. The chart was reviewed.   No complaints today. She was given Radioplex gel to use when necessary.  Physical Examination:  Filed Vitals:   03/25/13 1251  BP: 139/82  Pulse: 72  Temp: 98.7 F (37.1 C)  .  Weight: 257 lb 9.6 oz (116.847 kg). No skin changes.  Impression: Tolerating radiation therapy well.  Plan: Continue radiation therapy as planned.

## 2013-03-25 NOTE — Progress Notes (Signed)
Haskel Khan here for weekly under treat visit.  She has had 1 fraction to her right breast.  She denies pain and fatigue.  She was given the radiation therapy and you book and discussed the possible side effects of radiation including fatigue and skin changes.  She was given alra deoderant and radiaplex gel and was instructed to apply it twice a day after treatment and at bedtime.  She was advised to contact nursing with any questions she may have.

## 2013-03-26 ENCOUNTER — Ambulatory Visit
Admission: RE | Admit: 2013-03-26 | Discharge: 2013-03-26 | Disposition: A | Discharge status: Home / Self Care | Payer: BC Managed Care – PPO | Source: Ambulatory Visit | Attending: Radiation Oncology | Admitting: Radiation Oncology

## 2013-03-27 ENCOUNTER — Ambulatory Visit
Admission: RE | Admit: 2013-03-27 | Discharge: 2013-03-27 | Disposition: A | Discharge status: Home / Self Care | Payer: BC Managed Care – PPO | Source: Ambulatory Visit | Attending: Radiation Oncology | Admitting: Radiation Oncology

## 2013-03-28 ENCOUNTER — Ambulatory Visit
Admission: RE | Admit: 2013-03-28 | Discharge: 2013-03-28 | Disposition: A | Discharge status: Home / Self Care | Payer: BC Managed Care – PPO | Source: Ambulatory Visit | Attending: Radiation Oncology | Admitting: Radiation Oncology

## 2013-03-29 ENCOUNTER — Ambulatory Visit
Admission: RE | Admit: 2013-03-29 | Discharge: 2013-03-29 | Disposition: A | Discharge status: Home / Self Care | Payer: BC Managed Care – PPO | Source: Ambulatory Visit | Attending: Radiation Oncology | Admitting: Radiation Oncology

## 2013-04-01 ENCOUNTER — Encounter: Payer: Self-pay | Admitting: Radiation Oncology

## 2013-04-01 ENCOUNTER — Ambulatory Visit
Admission: RE | Admit: 2013-04-01 | Discharge: 2013-04-01 | Disposition: A | Discharge status: Home / Self Care | Payer: BC Managed Care – PPO | Source: Ambulatory Visit | Attending: Radiation Oncology | Admitting: Radiation Oncology

## 2013-04-01 VITALS — BP 143/83 | HR 71 | Temp 97.6°F | Resp 20 | Wt 259.7 lb

## 2013-04-01 DIAGNOSIS — C50211 Malignant neoplasm of upper-inner quadrant of right female breast: Secondary | ICD-10-CM

## 2013-04-01 NOTE — Progress Notes (Signed)
wekly rad txs, 6 right breast tx so far, no kin changes, not using radiplex gel as yet, no c/o pain or discomfort, mild nausea  This weekend 11:59 AM

## 2013-04-01 NOTE — Progress Notes (Signed)
Weekly Management Note:  Site: Right breast Current Dose:  1080  cGy Projected Dose: 4500  cGy followed by a boost  Narrative: The patient is seen today for routine under treatment assessment. CBCT/MVCT images/port films were reviewed. The chart was reviewed.   She has not yet started used Radioplex gel. She is without complaints today.  Physical Examination:  Filed Vitals:   04/01/13 1158  BP: 143/83  Pulse: 71  Temp: 97.6 F (36.4 C)  Resp: 20  .  Weight: 259 lb 11.2 oz (117.799 kg). There is faint erythema the skin along the right breast. Right breast seroma is still palpable.  Impression: Tolerating radiation therapy well.  Plan: Continue radiation therapy as planned.

## 2013-04-02 ENCOUNTER — Telehealth: Payer: Self-pay | Admitting: *Deleted

## 2013-04-02 ENCOUNTER — Ambulatory Visit
Admission: RE | Admit: 2013-04-02 | Discharge: 2013-04-02 | Disposition: A | Discharge status: Home / Self Care | Payer: BC Managed Care – PPO | Source: Ambulatory Visit | Attending: Radiation Oncology | Admitting: Radiation Oncology

## 2013-04-02 NOTE — Telephone Encounter (Signed)
Pt called to schedule appt with Dr. Darnelle Catalan after radiation treatment.  Appt scheduled on 05/20/13 at 0900.  Confirmed appt date and time with pt.  Pt denies further needs at this time.  Contact information given.

## 2013-04-03 ENCOUNTER — Ambulatory Visit
Admission: RE | Admit: 2013-04-03 | Discharge: 2013-04-03 | Disposition: A | Discharge status: Home / Self Care | Payer: BC Managed Care – PPO | Source: Ambulatory Visit | Attending: Radiation Oncology | Admitting: Radiation Oncology

## 2013-04-04 ENCOUNTER — Ambulatory Visit
Admission: RE | Admit: 2013-04-04 | Discharge: 2013-04-04 | Disposition: A | Discharge status: Home / Self Care | Payer: BC Managed Care – PPO | Source: Ambulatory Visit | Attending: Radiation Oncology | Admitting: Radiation Oncology

## 2013-04-05 ENCOUNTER — Ambulatory Visit
Admission: RE | Admit: 2013-04-05 | Discharge: 2013-04-05 | Disposition: A | Discharge status: Home / Self Care | Payer: BC Managed Care – PPO | Source: Ambulatory Visit | Attending: Radiation Oncology | Admitting: Radiation Oncology

## 2013-04-08 ENCOUNTER — Ambulatory Visit
Admission: RE | Admit: 2013-04-08 | Discharge: 2013-04-08 | Disposition: A | Discharge status: Home / Self Care | Payer: BC Managed Care – PPO | Source: Ambulatory Visit | Attending: Radiation Oncology | Admitting: Radiation Oncology

## 2013-04-08 VITALS — BP 125/92 | HR 72 | Temp 97.7°F | Ht 64.0 in | Wt 252.3 lb

## 2013-04-08 DIAGNOSIS — C50211 Malignant neoplasm of upper-inner quadrant of right female breast: Secondary | ICD-10-CM

## 2013-04-08 NOTE — Progress Notes (Signed)
Jean [name] here for weekly under treat visit.  She has had 11 fractions to her right breast.  She is having twinges of pain with soreness in her right breast that she is rating at a 1/10.  She does have fatigue.  The skin on her right breast is intact and pink.  She has been using radiaplex gel twice a day.  She does need a letter for her long term disability saying that she has radiation Monday - Friday.

## 2013-04-08 NOTE — Progress Notes (Addendum)
   Weekly Management Note:  outpatient Current Dose:  19.8 Gy  Projected Dose: 45Gy + boost   Narrative:  The patient presents for routine under treatment assessment.  CBCT/MVCT images/Port film x-rays were reviewed.  The chart was checked. She is doing relatively well.  Right breast a little tender  Physical Findings:  height is 5\' 4"  (1.626 m) and weight is 252 lb 4.8 oz (114.443 kg). Her temperature is 97.7 F (36.5 C). Her blood pressure is 125/92 and her pulse is 72.  right breast erythematous, skin intact  Impression:  The patient is tolerating radiotherapy.  Plan:  Continue radiotherapy as planned. Letter provided for disability paperwork.  ________________________________   Lonie Peak, M.D.

## 2013-04-09 ENCOUNTER — Ambulatory Visit
Admission: RE | Admit: 2013-04-09 | Discharge: 2013-04-09 | Disposition: A | Discharge status: Home / Self Care | Payer: BC Managed Care – PPO | Source: Ambulatory Visit | Attending: Radiation Oncology | Admitting: Radiation Oncology

## 2013-04-10 ENCOUNTER — Ambulatory Visit
Admission: RE | Admit: 2013-04-10 | Discharge: 2013-04-10 | Disposition: A | Discharge status: Home / Self Care | Payer: BC Managed Care – PPO | Source: Ambulatory Visit | Attending: Radiation Oncology | Admitting: Radiation Oncology

## 2013-04-11 ENCOUNTER — Ambulatory Visit
Admission: RE | Admit: 2013-04-11 | Discharge: 2013-04-11 | Disposition: A | Discharge status: Home / Self Care | Payer: BC Managed Care – PPO | Source: Ambulatory Visit | Attending: Radiation Oncology | Admitting: Radiation Oncology

## 2013-04-12 ENCOUNTER — Ambulatory Visit
Admission: RE | Admit: 2013-04-12 | Discharge: 2013-04-12 | Disposition: A | Discharge status: Home / Self Care | Payer: BC Managed Care – PPO | Source: Ambulatory Visit | Attending: Radiation Oncology | Admitting: Radiation Oncology

## 2013-04-15 ENCOUNTER — Encounter: Payer: Self-pay | Admitting: Neurology

## 2013-04-15 ENCOUNTER — Ambulatory Visit (INDEPENDENT_AMBULATORY_CARE_PROVIDER_SITE_OTHER): Payer: BC Managed Care – PPO | Admitting: Neurology

## 2013-04-15 ENCOUNTER — Ambulatory Visit
Admission: RE | Admit: 2013-04-15 | Discharge: 2013-04-15 | Disposition: A | Discharge status: Home / Self Care | Payer: BC Managed Care – PPO | Source: Ambulatory Visit | Attending: Radiation Oncology | Admitting: Radiation Oncology

## 2013-04-15 ENCOUNTER — Encounter: Payer: Self-pay | Admitting: Radiation Oncology

## 2013-04-15 VITALS — BP 130/74 | HR 72 | Temp 98.3°F | Resp 20 | Wt 259.9 lb

## 2013-04-15 VITALS — BP 118/70 | HR 66 | Temp 97.8°F | Wt 257.0 lb

## 2013-04-15 DIAGNOSIS — C50211 Malignant neoplasm of upper-inner quadrant of right female breast: Secondary | ICD-10-CM

## 2013-04-15 DIAGNOSIS — C50219 Malignant neoplasm of upper-inner quadrant of unspecified female breast: Secondary | ICD-10-CM

## 2013-04-15 NOTE — Patient Instructions (Addendum)
1.  Reserve the vestibular exercises for vestibular attacks. 2.  Follow up as needed.

## 2013-04-15 NOTE — Progress Notes (Signed)
NEUROLOGY FOLLOW UP OFFICE NOTE  Jean [name] [id number]  HISTORY OF PRESENT ILLNESS: Jean [name] is a 52 year old woman who presents for follow up regarding vertigo.  She was previously followed by Dr. Murriel Hopper, who has since left the neurology practice.  Records and images were personally reviewed where available.  She has had prior episodes of positional vertigo in the past.  This current attack started several months ago.  She experiences sensation of the room spinning, lasting approximately 30 seconds, associated with head movement and change in position.  She also has a sensation of her body moving forward.  Episodes seem to be triggered by head cold and nasal congestion.  She denies hearing loss, numbness.  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   MRI brain was unremarkable.  Dr. Smiley Houseman prescribed diazepam 2mg  BID, as well as vestibular exercises, specifically Brandt-Daroff exercises.  However, symptoms worsened after a sinus infection.  She was then prescribed Zyrtec and acupuncture.  Her new regimen was going well, but she was then diagnosed with breast cancer.  Testing and treatment, such as the biopsy and lumpectomy, cause discomfort and interfered with her ability to perform these exercises.  Her short-term disability was extended for an additional two months.  Since last visit, she is doing well.  She is currently receiving radiation therapy and she has been able to resume her exercises.  Her symptoms resolved about two weeks ago.  She is doing well and able to perform tasks such as bending down to tie her shoe.  PAST MEDICAL HISTORY: Past Medical History  Diagnosis Date  . Vertigo     pt doesn't take any medication  . Pulmonary embolism 2012    takes Xarelto daily  . Obesity   . Breast cancer   . Hyperlipidemia     takes Lipitor daily  . PUD (peptic ulcer disease)     remote  . COPD with asthma   . Nasal  congestion     takes Sudafed if needed)  . Depression     takes Viibryd daily as well as Abilify  . Pneumonia     hx of;last time around 1987  . Arthritis   . Joint pain   . GERD (gastroesophageal reflux disease)     related to Xarelto;takes Omeprazole daily  . Stress incontinence   . Nocturia     MEDICATIONS: Current Outpatient Prescriptions on File Prior to Visit  Medication Sig Dispense Refill  . ARIPiprazole (ABILIFY) 5 MG tablet Take 5 mg by mouth daily. 2.5 mg daily      . atorvastatin (LIPITOR) 20 MG tablet Take 20 mg by mouth daily.      . celecoxib (CELEBREX) 200 MG capsule Take 200 mg by mouth 2 (two) times daily.      . diazepam (VALIUM) 2 MG tablet       . hyaluronate sodium (RADIAPLEXRX) GEL Apply 1 application topically 2 (two) times daily.      Marland Kitchen ibuprofen (ADVIL,MOTRIN) 800 MG tablet       . non-metallic deodorant (ALRA) MISC Apply topically daily as needed.      Marland Kitchen omeprazole (PRILOSEC) 20 MG capsule Take 20 mg by mouth daily.      . pseudoephedrine (SUDAFED) 30 MG tablet Take 30 mg by mouth every 4 (four) hours as needed for congestion.      . Vilazodone HCl (VIIBRYD) 40 MG TABS Take  by mouth daily.      Marland Kitchen zolpidem (AMBIEN) 5 MG tablet        No current facility-administered medications on file prior to visit.    ALLERGIES: Allergies  Allergen Reactions  . Codeine Shortness Of Breath    FAMILY HISTORY: Family History  Problem Relation Age of Onset  . Adopted: Yes  . Breast cancer Maternal Aunt   . Breast cancer Maternal Grandmother   . Breast cancer Cousin 40    SOCIAL HISTORY: History   Social History  . Marital Status: Single    Spouse Name: N/A    Number of Children: N/A  . Years of Education: N/A   Occupational History  . Not on file.   Social History Main Topics  . Smoking status: Never Smoker   . Smokeless tobacco: Never Used  . Alcohol Use: No  . Drug Use: No  . Sexual Activity: No     Comment: menarche age 64, perimenopause age  46, HRT x 5 yrs, G0 P0   Other Topics Concern  . Not on file   Social History Narrative  . No narrative on file    PHYSICAL EXAM: Filed Vitals:   04/15/13 1032  BP: 118/70  Pulse: 66  Temp: 97.8 F (36.6 C)   General: No acute distress Head:  Normocephalic/atraumatic Neck: supple, no paraspinal tenderness, full range of motion Back: No paraspinal tenderness Neurological Exam: alert and oriented to person, place, and time, speech fluent and not dysarthric, language intact.  CN II-XII intact, bulk and tone normal, muscle strength 5/5 throughout, sensation to light touch, temperature and vibration intact, deep tendon reflexes trace upper extremities, absent lower extremities, toes downgoing, finger to nose intact, gait normal, mildly sways when walks in tandem but remains steady, Romberg negative.  IMPRESSION & PLAN: Benign paroxysmal positional vertigo, resolved. - May discontinue vestibular exercises now that it has resolved.   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  I will draft a letter stating such. - Follow up as needed.  30 minutes spent with patient, over 50% spent reviewing the chart, counseling and coordinating care.  Shon Millet, DO  CC:  Geoffry Paradise, MD

## 2013-04-15 NOTE — Progress Notes (Signed)
Weekly rad txs, 16/25 right breast,. Mild erythema, tenderness, dermatitis, under axilla using radiaplex gel bid, eating well, no fatigue 1:22 PM

## 2013-04-15 NOTE — Progress Notes (Signed)
Weekly Management Note:  Site: Right breast Current Dose:  2880  cGy Projected Dose: 4500  cGy  Narrative: The patient is seen today for routine under treatment assessment. CBCT/MVCT images/port films were reviewed. The chart was reviewed.   She is without new complaints today. She uses Radioplex gel.  Physical Examination:  Filed Vitals:   04/15/13 1320  BP: 130/74  Pulse: 72  Temp: 98.3 F (36.8 C)  Resp: 20  .  Weight: 259 lb 14.4 oz (117.89 kg). There is mild erythema along the breast, and in particular the lower axilla. No areas of desquamation.  Impression: Tolerating radiation therapy well.  Plan: Continue radiation therapy as planned.

## 2013-04-16 ENCOUNTER — Ambulatory Visit
Admission: RE | Admit: 2013-04-16 | Discharge: 2013-04-16 | Disposition: A | Discharge status: Home / Self Care | Payer: BC Managed Care – PPO | Source: Ambulatory Visit | Attending: Radiation Oncology | Admitting: Radiation Oncology

## 2013-04-17 ENCOUNTER — Ambulatory Visit
Admission: RE | Admit: 2013-04-17 | Discharge: 2013-04-17 | Disposition: A | Discharge status: Home / Self Care | Payer: BC Managed Care – PPO | Source: Ambulatory Visit | Attending: Radiation Oncology | Admitting: Radiation Oncology

## 2013-04-18 ENCOUNTER — Encounter: Payer: Self-pay | Admitting: Genetic Counselor

## 2013-04-18 ENCOUNTER — Other Ambulatory Visit: Payer: BC Managed Care – PPO | Admitting: Lab

## 2013-04-18 ENCOUNTER — Ambulatory Visit
Admission: RE | Admit: 2013-04-18 | Discharge: 2013-04-18 | Disposition: A | Discharge status: Home / Self Care | Payer: BC Managed Care – PPO | Source: Ambulatory Visit | Attending: Radiation Oncology | Admitting: Radiation Oncology

## 2013-04-18 ENCOUNTER — Ambulatory Visit (HOSPITAL_BASED_OUTPATIENT_CLINIC_OR_DEPARTMENT_OTHER): Payer: BC Managed Care – PPO | Admitting: Genetic Counselor

## 2013-04-18 DIAGNOSIS — C50211 Malignant neoplasm of upper-inner quadrant of right female breast: Secondary | ICD-10-CM

## 2013-04-18 DIAGNOSIS — Z803 Family history of malignant neoplasm of breast: Secondary | ICD-10-CM

## 2013-04-18 DIAGNOSIS — C50219 Malignant neoplasm of upper-inner quadrant of unspecified female breast: Secondary | ICD-10-CM

## 2013-04-18 NOTE — Progress Notes (Signed)
Dr.  Raymond Gurney [name] requested a consultation for genetic counseling and risk assessment for Jean [name], a 52 y.o. female, for discussion of her personal history of breast cancer and family history of breast cancer.  She presents to clinic today to discuss the possibility of a genetic predisposition to cancer, and to further clarify her risks, as well as her family members' risks for cancer.   HISTORY OF PRESENT ILLNESS: In 2014, at the age of 63, Jean [name] was diagnosed with invasive ductal carcinoma. This was treated with lumpectomy and will be treated also with radiation.  She will not get chemotherapy.  Her tumor is ER+PR+Her2-.  She has never had a colonoscopy.  The patient is adopted but has found her birth mother who provided her the information about her biological family.  Jean [name] had been on hormone treatment until she had a PE at age 25.  She reports having a coagulation workup that was negative.    Past Medical History  Diagnosis Date  . Vertigo     pt doesn't take any medication  . Pulmonary embolism 2012    takes Xarelto daily  . Obesity   . Breast cancer   . Hyperlipidemia     takes Lipitor daily  . PUD (peptic ulcer disease)     remote  . COPD with asthma   . Nasal congestion     takes Sudafed if needed)  . Depression     takes Viibryd daily as well as Abilify  . Pneumonia     hx of;last time around 1987  . Arthritis   . Joint pain   . GERD (gastroesophageal reflux disease)     related to Xarelto;takes Omeprazole daily  . Stress incontinence   . Nocturia     Past Surgical History  Procedure Laterality Date  . Adenoidectomy  1967  . Breast biopsy  May 2014    stage I beast cancer  . Breast lumpectomy with needle localization and axillary sentinel lymph node bx Right 02/07/2013    Procedure: BREAST LUMPECTOMY WITH NEEDLE LOCALIZATION AND AXILLARY SENTINEL LYMPH NODE BX;  Surgeon: Shelly Rubenstein, MD;  Location: [facility] OR;  Service: General;   Laterality: Right;    History   Social History  . Marital Status: Single    Spouse Name: N/A    Number of Children: N/A  . Years of Education: N/A   Social History Main Topics  . Smoking status: Never Smoker   . Smokeless tobacco: Never Used  . Alcohol Use: No  . Drug Use: No  . Sexual Activity: No     Comment: menarche age 60, perimenopause age 59, HRT x 5 yrs, G0 P0   Other Topics Concern  . None   Social History Narrative  . None    REPRODUCTIVE HISTORY AND PERSONAL RISK ASSESSMENT FACTORS: Menarche was at age 8.   postmenopausal Uterus Intact: yes Ovaries Intact: yes G1P0A1, first live birth at age N/A  She has not previously undergone treatment for infertility.   Oral Contraceptive use: 0 years   She has used HRT in the past.    FAMILY HISTORY:  We obtained a detailed, 4-generation family history.  Significant diagnoses are listed below: Family History  Problem Relation Age of Onset  . Adopted: Yes  . Breast cancer Maternal Aunt 60  . Breast cancer Maternal Grandmother     post menopausal breast cancer  . Breast cancer Cousin 40    possible bilateral cancer  Patient's maternal ancestors are of [place] and [place] descent, and paternal ancestors are of unknown descent. There is no reported Ashkenazi Jewish ancestry. There in no known consanguinity.  GENETIC COUNSELING ASSESSMENT: Jean [name] is a 52 y.o. female with a personal history of breast cancer and family history of breast cancer which somewhat suggestive of a hereditary breast and ovarian cancer syndrome and predisposition to cancer. We, therefore, discussed and recommended the following at today's visit.   DISCUSSION: We reviewed the characteristics, features and inheritance patterns of hereditary cancer syndromes. We also discussed genetic testing, including the appropriate family members to test, the process of testing, insurance coverage and turn-around-time for results.   In order to  estimate her chance of having a BRCA mutation, we used statistical models (PEnn II) and laboratory data that take into account her personal medical history, family history and ancestry.  Because each model is different, there can be a lot of variability in the risks they give.  Therefore, these numbers must be considered a rough range and not a precise risk of having a BRCA mutation.  These models estimate that she has approximately a 17% chance of having a mutation. Based on this assessment of her family and personal history, genetic testing is recommended.  PLAN: After considering the risks, benefits, and limitations, Jean [name] provided informed consent to pursue genetic testing and the blood sample will be sent to [facility] for analysis of the Breast/ovarian cancer panel. We discussed the implications of a positive, negative and/ or variant of uncertain significance genetic test result. Results should be available within approximately 3-4 weeks' time, at which point they will be disclosed by telephone to Jean [name], as will any additional recommendations warranted by these results. Jean [name] will receive a summary of her genetic counseling visit and a copy of her results once available. This information will also be available in Epic. We encouraged Jean [name] to remain in contact with cancer genetics annually so that we can continuously update the family history and inform her of any changes in cancer genetics and testing that may be of benefit for her family. Jean [name]'s questions were answered to her satisfaction today. Our contact information was provided should additional questions or concerns arise.  The patient was seen for a total of 60 minutes, greater than 50% of which was spent face-to-face counseling.  This plan is being carried out per Dr. Feliz Beam recommendations.  This note will also be sent to the referring provider via the electronic  medical record. The patient will be supplied with a summary of this genetic counseling discussion as well as educational information on the discussed hereditary cancer syndromes following the conclusion of their visit.   Patient was discussed with Dr. Drue [name].   _______________________________________________________________________ For Office Staff:  Number of people involved in session: 1 Was an Intern/ student involved with case: no

## 2013-04-19 ENCOUNTER — Ambulatory Visit
Admission: RE | Admit: 2013-04-19 | Discharge: 2013-04-19 | Disposition: A | Discharge status: Home / Self Care | Payer: BC Managed Care – PPO | Source: Ambulatory Visit | Attending: Radiation Oncology | Admitting: Radiation Oncology

## 2013-04-22 ENCOUNTER — Ambulatory Visit
Admission: RE | Admit: 2013-04-22 | Discharge: 2013-04-22 | Disposition: A | Discharge status: Home / Self Care | Payer: BC Managed Care – PPO | Source: Ambulatory Visit | Attending: Radiation Oncology | Admitting: Radiation Oncology

## 2013-04-22 ENCOUNTER — Encounter: Payer: Self-pay | Admitting: Radiation Oncology

## 2013-04-22 VITALS — BP 133/87 | HR 73 | Temp 97.5°F | Resp 20 | Wt 258.8 lb

## 2013-04-22 DIAGNOSIS — C50211 Malignant neoplasm of upper-inner quadrant of right female breast: Secondary | ICD-10-CM

## 2013-04-22 MED ORDER — RADIAPLEXRX EX GEL
Freq: Once | CUTANEOUS | Status: AC
  Administered 2013-04-22: 12:00:00 via TOPICAL

## 2013-04-22 NOTE — Progress Notes (Addendum)
Weekly rad tx 21/30 completed, erythema on breast, under axilla and inframmary fold peeling dry desquamation, , fatigue has set in stated patient, radiaplex tube given  Again  11:32 AM

## 2013-04-22 NOTE — Progress Notes (Addendum)
Weekly Management Note:  Site: Right breast Current Dose:  3780  cGy Projected Dose: 4500  cGy followed by a boost Narrative: The patient is seen today for routine under treatment assessment. CBCT/MVCT images/port films were reviewed. The chart was reviewed.   She does admit to slight fatigue. She uses Radioplex gel.  Physical Examination:  Filed Vitals:   04/22/13 1130  BP: 133/87  Pulse: 73  Temp: 97.5 F (36.4 C)  Resp: 20  .  Weight: 258 lb 12.8 oz (117.391 kg). There is erythema along the right breast, particularly along the axilla and inframammary region. No areas of desquamation.  Impression: Tolerating radiation therapy well. I suggested that she use cornstarch along the inframammary region to keep this region dry.  Plan: Continue radiation therapy as planned.

## 2013-04-23 ENCOUNTER — Ambulatory Visit
Admission: RE | Admit: 2013-04-23 | Discharge: 2013-04-23 | Disposition: A | Discharge status: Home / Self Care | Payer: BC Managed Care – PPO | Source: Ambulatory Visit | Attending: Radiation Oncology | Admitting: Radiation Oncology

## 2013-04-24 ENCOUNTER — Ambulatory Visit
Admission: RE | Admit: 2013-04-24 | Discharge: 2013-04-24 | Disposition: A | Discharge status: Home / Self Care | Payer: BC Managed Care – PPO | Source: Ambulatory Visit | Attending: Radiation Oncology | Admitting: Radiation Oncology

## 2013-04-25 ENCOUNTER — Ambulatory Visit
Admission: RE | Admit: 2013-04-25 | Discharge: 2013-04-25 | Disposition: A | Discharge status: Home / Self Care | Payer: BC Managed Care – PPO | Source: Ambulatory Visit | Attending: Radiation Oncology | Admitting: Radiation Oncology

## 2013-04-25 ENCOUNTER — Encounter: Payer: Self-pay | Admitting: Radiation Oncology

## 2013-04-25 NOTE — Progress Notes (Signed)
Complex simulation note: The patient underwent virtual simulation for her right breast boost. She was set up to LAO, RPO, and RAO fields. 3 sets of multileaf collimators were designed to conform the field. She is being treated with mixed 6 MV/18 MV photons. I prescribing a further 1000 cGy 5 sessions. I chose the 97% isodose curve for the prescription dose.

## 2013-04-26 ENCOUNTER — Encounter: Payer: Self-pay | Admitting: Radiation Oncology

## 2013-04-26 ENCOUNTER — Ambulatory Visit
Admission: RE | Admit: 2013-04-26 | Discharge: 2013-04-26 | Disposition: A | Discharge status: Home / Self Care | Payer: BC Managed Care – PPO | Source: Ambulatory Visit | Attending: Radiation Oncology | Admitting: Radiation Oncology

## 2013-04-26 NOTE — Progress Notes (Signed)
Simulation Verification Note: Jean [name] underwent simulation verification today for her R Breast boost. Her isocenter was in good position and the MLCs contoured the treatment volume appropriately.

## 2013-04-30 ENCOUNTER — Ambulatory Visit
Admission: RE | Admit: 2013-04-30 | Discharge: 2013-04-30 | Disposition: A | Discharge status: Home / Self Care | Payer: BC Managed Care – PPO | Source: Ambulatory Visit | Attending: Radiation Oncology | Admitting: Radiation Oncology

## 2013-04-30 ENCOUNTER — Encounter: Payer: Self-pay | Admitting: Radiation Oncology

## 2013-04-30 VITALS — BP 116/71 | HR 67 | Resp 16 | Wt 251.4 lb

## 2013-04-30 DIAGNOSIS — C50211 Malignant neoplasm of upper-inner quadrant of right female breast: Secondary | ICD-10-CM

## 2013-04-30 NOTE — Progress Notes (Signed)
Dry desquamation noted under right axilla. Also, moist desquamation noted under right breast. Hyperpigmentation of right breast noted. Reports using radiaplex, neosporin and alra as directed. Reports only mild intermittent fatigue.

## 2013-04-30 NOTE — Progress Notes (Signed)
Weekly Management Note:  Site: Right breast Current Dose:  4700  cGy Projected Dose: 5500  cGy  Narrative: The patient is seen today for routine under treatment assessment. CBCT/MVCT images/port films were reviewed. The chart was reviewed.   She is without new complaints today. She uses cornstarch along the inframammary region and Radioplex elsewhere.  Physical Examination:  Filed Vitals:   04/30/13 1045  BP: 116/71  Pulse: 67  Resp: 16  .  Weight: 251 lb 6.4 oz (114.034 kg). There is a moist desquamation along the inframammary region of the right breast and a healing moist desquamation along her lower right axilla. No obvious infection.  Impression: Tolerating radiation therapy well. She'll finish her treatment next Monday.  Plan: Continue radiation therapy as planned. One-month followup visit after completion of radiation therapy next Monday.

## 2013-05-01 ENCOUNTER — Ambulatory Visit
Admission: RE | Admit: 2013-05-01 | Discharge: 2013-05-01 | Disposition: A | Discharge status: Home / Self Care | Payer: BC Managed Care – PPO | Source: Ambulatory Visit | Attending: Radiation Oncology | Admitting: Radiation Oncology

## 2013-05-02 ENCOUNTER — Encounter: Payer: Self-pay | Admitting: Genetic Counselor

## 2013-05-02 ENCOUNTER — Ambulatory Visit
Admission: RE | Admit: 2013-05-02 | Discharge: 2013-05-02 | Disposition: A | Discharge status: Home / Self Care | Payer: BC Managed Care – PPO | Source: Ambulatory Visit | Attending: Radiation Oncology | Admitting: Radiation Oncology

## 2013-05-02 ENCOUNTER — Telehealth: Payer: Self-pay | Admitting: Genetic Counselor

## 2013-05-02 NOTE — Telephone Encounter (Signed)
Revealed negative genetic test results 

## 2013-05-03 ENCOUNTER — Ambulatory Visit
Admission: RE | Admit: 2013-05-03 | Discharge: 2013-05-03 | Disposition: A | Discharge status: Home / Self Care | Payer: BC Managed Care – PPO | Source: Ambulatory Visit | Attending: Radiation Oncology | Admitting: Radiation Oncology

## 2013-05-06 ENCOUNTER — Ambulatory Visit
Admission: RE | Admit: 2013-05-06 | Discharge: 2013-05-06 | Disposition: A | Discharge status: Home / Self Care | Payer: BC Managed Care – PPO | Source: Ambulatory Visit | Attending: Radiation Oncology | Admitting: Radiation Oncology

## 2013-05-08 ENCOUNTER — Encounter: Payer: Self-pay | Admitting: Radiation Oncology

## 2013-05-08 ENCOUNTER — Encounter: Payer: Self-pay | Admitting: Physician Assistant

## 2013-05-08 NOTE — Progress Notes (Signed)
[facility] Radiation Oncology End of Treatment Note  Name:January Corrie Dandy  Date: 05/08/2013 ZOX:[id number] DOB:05/06/1961   Status:outpatient    CC: Minda Meo, MD  Dr. Abigail Miyamoto  REFERRING PHYSICIAN:   Dr. 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 She uses cornstarch and Radioplex gel during her course of therapy.                      PLAN: Routine followup in one month. Patient instructed to call if questions or worsening complaints in interim.

## 2013-05-09 ENCOUNTER — Encounter: Payer: Self-pay | Admitting: Gastroenterology

## 2013-05-13 ENCOUNTER — Ambulatory Visit (INDEPENDENT_AMBULATORY_CARE_PROVIDER_SITE_OTHER): Payer: BC Managed Care – PPO | Admitting: Surgery

## 2013-05-13 ENCOUNTER — Telehealth: Payer: Self-pay | Admitting: Gastroenterology

## 2013-05-13 NOTE — Telephone Encounter (Signed)
Pt says that she "had a lot of sedation with breast lumpectomy. I was told that I almost needed assistance to breathe"  I talked with pt and explained difference in moderate sedation and Propofol sedation.  Pt was satisfied with explanation

## 2013-05-20 ENCOUNTER — Ambulatory Visit (HOSPITAL_BASED_OUTPATIENT_CLINIC_OR_DEPARTMENT_OTHER): Payer: BC Managed Care – PPO | Admitting: Oncology

## 2013-05-20 ENCOUNTER — Telehealth: Payer: Self-pay | Admitting: *Deleted

## 2013-05-20 ENCOUNTER — Ambulatory Visit: Payer: BC Managed Care – PPO | Admitting: Physician Assistant

## 2013-05-20 ENCOUNTER — Encounter: Payer: Self-pay | Admitting: Oncology

## 2013-05-20 ENCOUNTER — Ambulatory Visit: Payer: BC Managed Care – PPO | Admitting: Oncology

## 2013-05-20 VITALS — BP 120/78 | HR 81 | Temp 98.1°F | Resp 18 | Ht 64.0 in | Wt 263.6 lb

## 2013-05-20 DIAGNOSIS — C50211 Malignant neoplasm of upper-inner quadrant of right female breast: Secondary | ICD-10-CM

## 2013-05-20 DIAGNOSIS — C50219 Malignant neoplasm of upper-inner quadrant of unspecified female breast: Secondary | ICD-10-CM

## 2013-05-20 NOTE — Telephone Encounter (Signed)
sw called to cancel her appt this am because she was stuck in traffic. gv appt for 05/20/13 @ 3:30pm..td

## 2013-05-20 NOTE — Progress Notes (Signed)
ID: [name] OB: [birth year]  MR#: [id number]  WUJ#:[id number]  PCP: Minda Meo, MD GYN:  Debbora Dus NP SU: Abigail Miyamoto OTHER MD: Chipper Herb, Rupinder Tiney Rouge   HISTORY OF PRESENT ILLNESS: "Jean [name]" had routine mammographic screening 10/02/2012 showing dense breasts (category C.). A possible distortion was noted in the right breast and a diagnostic right mammography and ultrasonography on 01/02/2013 confirmed a persistent area of distortion in the upper inner quadrant of the right breast measuring approximately 1 cm. This was not palpable. Ultrasound was not informative and showed no axillary adenopathy. 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  Bilateral breast MRIs 01/28/2013 showed a large hematoma at the biopsy site, measuring up to 6.1 cm, with minimal patchy enhancing nodularity at the periphery. There was a 1.7 cm lymph node in the right axillary region with a preserved fatty hilum but slightly thickened cortex. There were no other findings of concern.  The patient's subsequent history is as detailed below  INTERVAL HISTORY: Jean [name] returns today for followup of her breast cancer. Since her last visit here she had her definitive surgery, a low Oncotype score, and completed radiation treatments. She is here now to discuss antiestrogen therapy  REVIEW OF SYSTEMS: She did well with the radiation, would desquamation but no significant fatigue. She does have some tenderness and minor shooting pains in the right breast, which are very brief period she was reassured that these do not mean there is residual breast cancer there. She is having some feet pain and is being treated for arthritis in her feet. Her depression is well managed. She is having some hot flashes. Otherwise detailed review systems  today was noncontributory  PAST MEDICAL HISTORY: Past Medical History  Diagnosis Date  . Vertigo     pt doesn't take any medication  . Pulmonary embolism 2012    takes Xarelto daily  . Obesity   . Breast cancer   . Hyperlipidemia     takes Lipitor daily  . PUD (peptic ulcer disease)     remote  . COPD with asthma   . Nasal congestion     takes Sudafed if needed)  . Depression     takes Viibryd daily as well as Abilify  . Pneumonia     hx of;last time around 1987  . Arthritis   . Joint pain   . GERD (gastroesophageal reflux disease)     related to Xarelto;takes Omeprazole daily  . Stress incontinence   . Nocturia     PAST SURGICAL HISTORY: Past Surgical History  Procedure Laterality Date  . Adenoidectomy  1967  . Breast biopsy  May 2014    stage I beast cancer  . Breast lumpectomy with needle localization and axillary sentinel lymph node bx Right 02/07/2013    Procedure: BREAST LUMPECTOMY WITH NEEDLE LOCALIZATION AND AXILLARY SENTINEL LYMPH NODE BX;  Surgeon: Shelly Rubenstein, MD;  Location: [facility] OR;  Service: General;  Laterality: Right;    FAMILY HISTORY Family History  Problem Relation Age of Onset  . Adopted: Yes  . Breast cancer Maternal Aunt 60  . Breast cancer Maternal Grandmother     post menopausal breast cancer  . Breast cancer Cousin 40    possible bilateral cancer   the patient is adopted and has no information on her father's side of the  family. Her biological mother is currently 79. Her biological mothers mother had a diagnosis of breast cancer but the patient does not know at what age. Also 1 maternal aunt was diagnosed with breast cancer as well as a first cousin, and a first cousin was diagnosed at age 70. The patient is being set up for genetic counseling.  GYNECOLOGIC HISTORY:  Menarche age 82, perimenopause at around age 31. The patient then took hormone replacement for 5 years, quitting at age 9 when she developed a pulmonary embolus. She never  took birth control pills. She is GX P0.  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 At home she lives with her adoptive mother [name] and her adoptive brother [name]. The patient's adoptive father is currently at [facility] following orthopedic surgery. There is a second adoptive brother. The patient herself is single, but is planning to move in with her partner and her 4 children in [place] October 2014. She wishes to maintain her medical care here since her mother will not be moving and she will be coming to this area frequently.. The only pet at home is a Estate manager/land agent    ADVANCED DIRECTIVES: In place. The patient has named her adoptive mother, [name], as her healthcare power of attorney. Barbara's cell number is [phone]   HEALTH MAINTENANCE: History  Substance Use Topics  . Smoking status: Never Smoker   . Smokeless tobacco: Never Used  . Alcohol Use: No     Colonoscopy:  PAP: 2014  Bone density:  Lipid panel:  Allergies  Allergen Reactions  . Codeine Shortness Of Breath    Current Outpatient Prescriptions  Medication Sig Dispense Refill  . ARIPiprazole (ABILIFY) 5 MG tablet Take 5 mg by mouth daily. 2.5 mg daily      . atorvastatin (LIPITOR) 20 MG tablet Take 20 mg by mouth daily.      . celecoxib (CELEBREX) 200 MG capsule Take 200 mg by mouth 2 (two) times daily.      . diazepam (VALIUM) 2 MG tablet       . hyaluronate sodium (RADIAPLEXRX) GEL Apply 1 application topically 2 (two) times daily.      Marland Kitchen ibuprofen (ADVIL,MOTRIN) 800 MG tablet       . non-metallic deodorant (ALRA) MISC Apply topically daily as needed.      Marland Kitchen omeprazole (PRILOSEC) 20 MG capsule Take 20 mg by mouth daily.      . pseudoephedrine (SUDAFED) 30 MG tablet Take 30 mg by mouth every 4 (four) hours as needed for congestion.      . Vilazodone HCl (VIIBRYD) 40 MG TABS Take by mouth daily.      Marland Kitchen zolpidem (AMBIEN) 5 MG tablet        No current  facility-administered medications for this visit.    OBJECTIVE: Middle-aged white woman who appears well Filed Vitals:   05/20/13 1525  BP: 120/78  Pulse: 81  Temp: 98.1 F (36.7 C)  Resp: 18     Body mass index is 45.22 kg/(m^2).    ECOG FS: 1  Sclerae unicteric Oropharynx clear No cervical or supraclavicular adenopathy Lungs no rales or rhonchi Heart regular rate and rhythm Abd obese, benign MSK no focal spinal tenderness Neuro: non-focal, well-oriented, positive affect Breasts: The right breast is status post surgery and radiation. There is still some erythema but the desquamation has just about completely resolved. There is no evidence of recurrent or residual disease. There is  what looks like a candidal rash under both breasts, left greater than right. The right axilla is clear. The left breast is unremarkable.   LAB RESULTS:  CMP     Component Value Date/Time   NA 140 02/06/2013 0828   NA 141 01/30/2013 0823   K 3.8 02/06/2013 0828   K 4.1 01/30/2013 0823   CL 105 02/06/2013 0828   CL 105 01/30/2013 0823   CO2 26 02/06/2013 0828   CO2 27 01/30/2013 0823   GLUCOSE 133* 02/06/2013 0828   GLUCOSE 104* 01/30/2013 0823   BUN 16 02/06/2013 0828   BUN 14.8 01/30/2013 0823   CREATININE 0.84 02/06/2013 0828   CREATININE 0.8 01/30/2013 0823   CALCIUM 9.4 02/06/2013 0828   CALCIUM 9.4 01/30/2013 0823   PROT 7.0 01/30/2013 0823   ALBUMIN 3.3* 01/30/2013 0823   AST 18 01/30/2013 0823   ALT 23 01/30/2013 0823   ALKPHOS 99 01/30/2013 0823   BILITOT 0.61 01/30/2013 0823   GFRNONAA 80* 02/06/2013 0828   GFRAA >90 02/06/2013 0828    I No results found for this basename: SPEP,  UPEP,   kappa and lambda light chains    Lab Results  Component Value Date   WBC 5.1 02/06/2013   NEUTROABS 2.8 01/30/2013   HGB 12.3 02/06/2013   HCT 36.7 02/06/2013   MCV 87.4 02/06/2013   PLT 229 02/06/2013      Chemistry      Component Value Date/Time   NA 140 02/06/2013 0828   NA 141 01/30/2013 0823   K 3.8 02/06/2013 0828    K 4.1 01/30/2013 0823   CL 105 02/06/2013 0828   CL 105 01/30/2013 0823   CO2 26 02/06/2013 0828   CO2 27 01/30/2013 0823   BUN 16 02/06/2013 0828   BUN 14.8 01/30/2013 0823   CREATININE 0.84 02/06/2013 0828   CREATININE 0.8 01/30/2013 0823      Component Value Date/Time   CALCIUM 9.4 02/06/2013 0828   CALCIUM 9.4 01/30/2013 0823   ALKPHOS 99 01/30/2013 0823   AST 18 01/30/2013 0823   ALT 23 01/30/2013 0823   BILITOT 0.61 01/30/2013 0823       No results found for this basename: LABCA2    No components found with this basename: LABCA125    No results found for this basename: INR,  in the last 168 hours  Urinalysis No results found for this basename: colorurine,  appearanceur,  labspec,  phurine,  glucoseu,  hgbur,  bilirubinur,  ketonesur,  proteinur,  urobilinogen,  nitrite,  leukocytesur    STUDIES: No results found.  ASSESSMENT: 52 y.o. BRCA negative [place] woman status post right breast biopsy 01/22/2013 for a clinical T1b NX invasive ductal carcinoma, grade 2, estrogen receptor 90% positive, progesterone receptor 90% positive, with no HER-2 amplification and an MIB-1 of 10%.  (1) Status post right lumpectomy and sentinel lymph node sampling 02/07/2013 for a pT1b pN0, stage IA invasive ductal breast cancer, grade 2, repeat HER-2 again negative  (2) Oncotype score of 11 predicts a distant recurrence rate within 10 years of 8% if the patient's only systemic treatment is tamoxifen for 5 years  (3) adjuvant radiation completed 04/30/2013  (4) anastrozole started October 2014    PLAN: We discussed her genetic results, which showed no abnormality, and her Oncotype, which predicts a very low risk of recurrence if her only systemic treatment is tamoxifen for 5 years. However we will stay away from tamoxifen given the patient's history  of prior pulmonary embolism.  Accordingly she will start anastrozole. We discussed the possible toxicities, side effects and complications of this agent. She  will obtain in the next few days and she will return to see me sometime early December. We will obtain a bone density prior to that visit. If everything works out well and she has no significant side effects we will start seeing her on an every 6 month basis thereafter.  She knows to call for any problems that may develop before next visit here.  Lowella Dell, MD   05/20/2013 4:01 PM

## 2013-05-20 NOTE — Progress Notes (Signed)
ID: [name] OB: 1961/03/21  MR#: [id number]  QMV#:[id number]  PCP: Minda Meo, MD GYN:  Debbora Dus NP SU: Abigail Miyamoto OTHER MD: Chipper Herb, Rupinder Tiney Rouge   HISTORY OF PRESENT ILLNESS: "Jean [name]" had routine mammographic screening 10/02/2012 showing dense breasts (category C.). A possible distortion was noted in the right breast and a diagnostic right mammography and ultrasonography on 01/02/2013 confirmed a persistent area of distortion in the upper inner quadrant of the right breast measuring approximately 1 cm. This was not palpable. Ultrasound was not informative and showed no axillary adenopathy. 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  Bilateral breast MRIs 01/28/2013 showed a large hematoma at the biopsy site, measuring up to 6.1 cm, with minimal patchy enhancing nodularity at the periphery. There was a 1.7 cm lymph node in the right axillary region with a preserved fatty hilum but slightly thickened cortex. There were no other findings of concern.  The patient's subsequent history is as detailed below  INTERVAL HISTORY: The patient was evaluated at the multidisciplinary breast cancer clinic 01/30/2013 accompanied by her mother and her friend Lynden Ang  REVIEW OF SYSTEMS: There were no specific symptoms leading to the initial mammogram, which was scheduled routinely. Jean [name] does have significant vertigo problems, and was on leave from her job as a result. 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 She is very concerned about her weight and is interested in meeting with a dietitian to get a weight loss plan. She has problems with heartburn, stress urinary incontinence, depression, and hot flashes, all of which are being addressed and  are currently under good control. A detailed review of systems was otherwise noncontributory  PAST MEDICAL HISTORY: Past Medical History  Diagnosis Date  . Vertigo     pt doesn't take any medication  . Pulmonary embolism 2012    takes Xarelto daily  . Obesity   . Breast cancer   . Hyperlipidemia     takes Lipitor daily  . PUD (peptic ulcer disease)     remote  . COPD with asthma   . Nasal congestion     takes Sudafed if needed)  . Depression     takes Viibryd daily as well as Abilify  . Pneumonia     hx of;last time around 1987  . Arthritis   . Joint pain   . GERD (gastroesophageal reflux disease)     related to Xarelto;takes Omeprazole daily  . Stress incontinence   . Nocturia     PAST SURGICAL HISTORY: Past Surgical History  Procedure Laterality Date  . Adenoidectomy  1967  . Breast biopsy  May 2014    stage I beast cancer  . Breast lumpectomy with needle localization and axillary sentinel lymph node bx Right 02/07/2013    Procedure: BREAST LUMPECTOMY WITH NEEDLE LOCALIZATION AND AXILLARY SENTINEL LYMPH NODE BX;  Surgeon: Shelly Rubenstein, MD;  Location: [facility] OR;  Service: General;  Laterality: Right;    FAMILY HISTORY Family History  Problem Relation Age of Onset  . Adopted: Yes  . Breast cancer Maternal Aunt 60  . Breast cancer Maternal Grandmother     post menopausal breast cancer  . Breast cancer Cousin 40    possible bilateral cancer   the patient is adopted  and has no information on her father's side of the family. Her biological mother is currently 50. Her biological mothers mother had a diagnosis of breast cancer but the patient does not know at what age. Also 1 maternal aunt was diagnosed with breast cancer as well as a first cousin, and a first cousin was diagnosed at age 50. The patient is being set up for genetic counseling.  GYNECOLOGIC HISTORY:  Menarche age 52, perimenopause at around age 45. The patient then took hormone replacement for 5 years,  quitting at age 52 when she developed a pulmonary embolus. She never took birth control pills. She is GX P0.  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 At home she lives with her adoptive mother Jean [name] and her adoptive brother Jean [name]. The patient's adoptive father is currently at [facility] following orthopedic surgery. There is a second adoptive brother. The patient herself is single. The only pet at home is a Estate manager/land agent    ADVANCED DIRECTIVES: In place. The patient has named her adoptive mother, Jean [name], as her healthcare power of attorney. Barbara's cell number is [phone]   HEALTH MAINTENANCE: History  Substance Use Topics  . Smoking status: Never Smoker   . Smokeless tobacco: Never Used  . Alcohol Use: No     Colonoscopy:  PAP: 2014  Bone density:  Lipid panel:  Allergies  Allergen Reactions  . Codeine Shortness Of Breath    Current Outpatient Prescriptions  Medication Sig Dispense Refill  . ARIPiprazole (ABILIFY) 5 MG tablet Take 5 mg by mouth daily. 2.5 mg daily      . atorvastatin (LIPITOR) 20 MG tablet Take 20 mg by mouth daily.      . celecoxib (CELEBREX) 200 MG capsule Take 200 mg by mouth 2 (two) times daily.      . diazepam (VALIUM) 2 MG tablet       . hyaluronate sodium (RADIAPLEXRX) GEL Apply 1 application topically 2 (two) times daily.      Marland Kitchen ibuprofen (ADVIL,MOTRIN) 800 MG tablet       . non-metallic deodorant (ALRA) MISC Apply topically daily as needed.      Marland Kitchen omeprazole (PRILOSEC) 20 MG capsule Take 20 mg by mouth daily.      . pseudoephedrine (SUDAFED) 30 MG tablet Take 30 mg by mouth every 4 (four) hours as needed for congestion.      . Vilazodone HCl (VIIBRYD) 40 MG TABS Take by mouth daily.      Marland Kitchen zolpidem (AMBIEN) 5 MG tablet        No current facility-administered medications for this visit.    OBJECTIVE: Middle-aged white woman in no acute distress There were no vitals filed for this visit.   There is  no weight on file to calculate BMI.    ECOG FS:  Sclerae unicteric Oropharynx clear No cervical or supraclavicular adenopathy Lungs no rales or rhonchi Heart regular rate and rhythm Abd obese, benign MSK no focal spinal tenderness Neuro: non-focal, well-oriented, appropriate affect Breasts: The right breast is status post recent biopsy. There is a significant ecchymosis, and a palpable mass which we know by MRI is mostly hematoma. The right axilla is clear. The left breast is unremarkable.   LAB RESULTS:  CMP     Component Value Date/Time   NA 140 02/06/2013 0828   NA 141 01/30/2013 0823   K 3.8 02/06/2013 0828   K 4.1 01/30/2013 0823   CL 105 02/06/2013  0828   CL 105 01/30/2013 0823   CO2 26 02/06/2013 0828   CO2 27 01/30/2013 0823   GLUCOSE 133* 02/06/2013 0828   GLUCOSE 104* 01/30/2013 0823   BUN 16 02/06/2013 0828   BUN 14.8 01/30/2013 0823   CREATININE 0.84 02/06/2013 0828   CREATININE 0.8 01/30/2013 0823   CALCIUM 9.4 02/06/2013 0828   CALCIUM 9.4 01/30/2013 0823   PROT 7.0 01/30/2013 0823   ALBUMIN 3.3* 01/30/2013 0823   AST 18 01/30/2013 0823   ALT 23 01/30/2013 0823   ALKPHOS 99 01/30/2013 0823   BILITOT 0.61 01/30/2013 0823   GFRNONAA 80* 02/06/2013 0828   GFRAA >90 02/06/2013 0828    I No results found for this basename: SPEP,  UPEP,   kappa and lambda light chains    Lab Results  Component Value Date   WBC 5.1 02/06/2013   NEUTROABS 2.8 01/30/2013   HGB 12.3 02/06/2013   HCT 36.7 02/06/2013   MCV 87.4 02/06/2013   PLT 229 02/06/2013      Chemistry      Component Value Date/Time   NA 140 02/06/2013 0828   NA 141 01/30/2013 0823   K 3.8 02/06/2013 0828   K 4.1 01/30/2013 0823   CL 105 02/06/2013 0828   CL 105 01/30/2013 0823   CO2 26 02/06/2013 0828   CO2 27 01/30/2013 0823   BUN 16 02/06/2013 0828   BUN 14.8 01/30/2013 0823   CREATININE 0.84 02/06/2013 0828   CREATININE 0.8 01/30/2013 0823      Component Value Date/Time   CALCIUM 9.4 02/06/2013 0828   CALCIUM 9.4 01/30/2013 0823   ALKPHOS 99  01/30/2013 0823   AST 18 01/30/2013 0823   ALT 23 01/30/2013 0823   BILITOT 0.61 01/30/2013 0823       No results found for this basename: LABCA2    No components found with this basename: LABCA125    No results found for this basename: INR,  in the last 168 hours  Urinalysis No results found for this basename: colorurine,  appearanceur,  labspec,  phurine,  glucoseu,  hgbur,  bilirubinur,  ketonesur,  proteinur,  urobilinogen,  nitrite,  leukocytesur    STUDIES: US Breast Right  01/15/2013   *RADIOLOGY REPORT*  Clinical Data:  Recall from screening mammography. The patient is adopted. However, she has recently located her birth mother and she states that there is a family history of breast cancer in her maternal grandmother, maternal aunts, and maternal cousin.  DIGITAL DIAGNOSTIC RIGHT BREAST MAMMOGRAM WITH CAD AND RIGHT BREAST ULTRASOUND:  Comparison:  12/31/2012, 10/07/2011.  Findings:  ACR Breast Density Category 3: The breast tissue is heterogeneously dense.  Additional views of the right breast demonstrate a persistent small area of distortion located within the upper inner quadrant of the right breast.  By mammography this measures approximately 1 cm in size.  This persists on right CC views with 15 degrees of medial and lateral angulation and, as a result, should be accessible to stereotactic biopsy.  Mammographic images were processed with CAD.  On physical exam, there is no discrete palpable abnormality within the medial right breast or upper inner quadrant of the right breast.  Ultrasound is performed, showing an area of questionable distortion located within the right breast at 2 o'clock position 5 cm from nipple.  However, a BB was placed overlying the area on ultrasound and this does not correspond to the mammographic finding.  Ultrasound of the right axilla demonstrates no  adenopathy and normal axillary contents.  IMPRESSION: Persistent area of distortion located within the upper inner  quadrant of the right breast.  Tissue sampling is recommended and stereotactic core biopsy will be scheduled.  RECOMMENDATION: Right breast stereotactic core biopsy.  I have discussed the findings and recommendations with the patient. Results were also provided in writing at the conclusion of the visit.  If applicable, a reminder letter will be sent to the patient regarding the next appointment.  BI-RADS CATEGORY 4:  Suspicious abnormality - biopsy should be considered.   Original Report Authenticated By: Rolla Plate, M.D.   Mr Breast Bilateral W Wo Contrast  01/28/2013   *RADIOLOGY REPORT*  Clinical Data: History of recent ultrasound guided malignant core biopsy 01/15/2013 demonstrating invasive ductal carcinoma with DCIS.  None  BILATERAL BREAST MRI WITH AND WITHOUT CONTRAST  Technique: Multiplanar, multisequence MR images of both breasts were obtained prior to and following the intravenous administration of 20ml of multihance.  Three dimensional images were evaluated at the independent DynaCad workstation.  Comparison:  Recent mammograms and ultrasounds.  Findings: Examination demonstrates minimal background parenchymal enhancement.  Right breast:  Metallic clip artifact is seen over the upper inner right breast from the patient's recent ultrasound core biopsy. There is a hematoma at the biopsy site measuring approximately 4.5 x 6.1 x 4.4 cm.  There is minimal patchy enhancing nodularity along the periphery of this hematoma likely related to patient's biopsy- proven malignancy at this site versus postsurgical change.  There is a 1.7 cm lymph node over the anterior inferior aspect of the right axillary region immediately lateral to the pectoralis major muscle as this node has a fatty hilum although slightly thickened cortex and malignancy cannot be excluded.  Left breast:  No suspicious masses, enhancement or adenopathy.  IMPRESSION: Known biopsy-proven malignancy over the upper inner right breast. Slightly  suspicious 1.7 cm lymph node over the anterior inferior right axillary region as malignancy cannot be excluded.  RECOMMENDATION: Recommend continued follow up as per clinical treatment plan.  THREE-DIMENSIONAL MR IMAGE RENDERING ON INDEPENDENT WORKSTATION:  Three-dimensional MR images were rendered by post-processing of the original MR data on an independent workstation.  The three- dimensional MR images were interpreted, and findings were reported in the accompanying complete MRI report for this study.  BI-RADS CATEGORY 6:  Known biopsy-proven malignancy - appropriate action should be taken.   Original Report Authenticated By: Elberta Fortis, M.D.   Mm Digital Diag Ltd R  01/15/2013   *RADIOLOGY REPORT*  Clinical Data:  Recall from screening mammography. The patient is adopted. However, she has recently located her birth mother and she states that there is a family history of breast cancer in her maternal grandmother, maternal aunts, and maternal cousin.  DIGITAL DIAGNOSTIC RIGHT BREAST MAMMOGRAM WITH CAD AND RIGHT BREAST ULTRASOUND:  Comparison:  12/31/2012, 10/07/2011.  Findings:  ACR Breast Density Category 3: The breast tissue is heterogeneously dense.  Additional views of the right breast demonstrate a persistent small area of distortion located within the upper inner quadrant of the right breast.  By mammography this measures approximately 1 cm in size.  This persists on right CC views with 15 degrees of medial and lateral angulation and, as a result, should be accessible to stereotactic biopsy.  Mammographic images were processed with CAD.  On physical exam, there is no discrete palpable abnormality within the medial right breast or upper inner quadrant of the right breast.  Ultrasound is performed, showing an area of questionable distortion  located within the right breast at 2 o'clock position 5 cm from nipple.  However, a BB was placed overlying the area on ultrasound and this does not correspond to the  mammographic finding.  Ultrasound of the right axilla demonstrates no adenopathy and normal axillary contents.  IMPRESSION: Persistent area of distortion located within the upper inner quadrant of the right breast.  Tissue sampling is recommended and stereotactic core biopsy will be scheduled.  RECOMMENDATION: Right breast stereotactic core biopsy.  I have discussed the findings and recommendations with the patient. Results were also provided in writing at the conclusion of the visit.  If applicable, a reminder letter will be sent to the patient regarding the next appointment.  BI-RADS CATEGORY 4:  Suspicious abnormality - biopsy should be considered.   Original Report Authenticated By: Rolla Plate, M.D.   Mm Digital Screening  01/01/2013   *RADIOLOGY REPORT*  Clinical Data: Screening.  DIGITAL BILATERAL SCREENING MAMMOGRAM WITH CAD DIGITAL BREAST TOMOSYNTHESIS  Digital breast tomosynthesis images are acquired in two projections.  These images are reviewed in combination with the digital mammogram, confirming the findings below.  Comparison: Previous exams.  FINDINGS:  ACR Breast Density Category 3: The breast tissue is heterogeneously dense.  In the right breast, possible distortion warrants further evaluation with spot compression views and possibly ultrasound.  In the left breast, no suspicious masses or malignant type calcifications are identified.  Images were processed with CAD.  IMPRESSION: Further evaluation is suggested for distortion in the right breast.  RECOMMENDATION:  Diagnostic mammogram and possibly ultrasound of the right breast. (Code:FI-R-40M)  The patient will be contacted regarding the findings, and additional imaging will be scheduled.  BI-RADS CATEGORY 0:  Incomplete.  Need additional imaging evaluation and/or prior mammograms for comparison.   Original Report Authenticated By: Hulan Saas, M.D.   Mm Rt Breast Bx W Loc Dev 1st Lesion Image Bx Spec Stereo Guide  01/28/2013    **ADDENDUM** CREATED: 01/28/2013 10:10:26  Addendum dictated by Dr. Azucena Kuba on 01/28/2013.  The original report was dictated by Dr. Jean Rosenthal.  The patient returned on 01/28/2013 following a diagnostic MRI of the breasts due to a concern about extensive right breast bruising with associated swelling and tenderness since her biopsy.  She also reports intermittent dark bloody fluid leaking from the biopsy incision.  The patient is taking Xarelto for a history of DVT.  On physical examination, the patient has bruising involving the majority of the right breast with increased firmness of the right breast compared to the left breast.  No significant difference in size of the right breast compared to the left breast. There is diffusely increased warmth of the skin of the right breast compared to the left breast with no skin redness.  She has an approximately 8 x 6 cm oval area of palpable firmness at the location of the biopsy site, compatible with a palpable hematoma.  The patient states that she is able to take a 10-day course of 200 mg of Celebrex twice a day without interfering with the Xarelto. Therefore she was instructed to do that to improve the warmth and tenderness of the right breast resulting from an inflammatory reaction to the blood in the breast.  She was asked to return for further evaluation if she develops redness and increased swelling of the breast.  **END ADDENDUM** SIGNED BY: Londell Moh. Azucena Kuba, M.D.  01/23/2013   **ADDENDUM** CREATED: 01/23/2013 10:58:18  The pathology associated with the right breast stereotactic core biopsy demonstrated invasive  ductal carcinoma with DCIS.  This is concordant with imaging findings.  I have discussed the findings with the patient by telephone and answered her questions.  The patient states she has mild tenderness at the biopsy site but no hematoma formation or signs of infection.  Post biopsy wound care instructions were reviewed with the patient.  The patient is scheduled  for the breast cancer multidisciplinary clinic on 01/30/2013.  Breast MRI will be scheduled.  The patient was encouraged to call the Breast [facility] for additional questions or concerns.  **END ADDENDUM** SIGNED BY: Rolla Plate, M.D.  01/22/2013   *RADIOLOGY REPORT*  Clinical Data:  Right breast distortion.  STEREOTACTIC-GUIDED VACUUM ASSISTED BIOPSY OF THE RIGHT BREAST AND SPECIMEN RADIOGRAPH  Comparison: Previous exams.  I met with the patient and we discussed the procedure of stereotactic-guided biopsy, including benefits and alternatives. We discussed the high likelihood of a successful procedure. We discussed the risks of the procedure, including infection, bleeding, tissue injury, clip migration, and inadequate sampling. Informed, written consent was given.  Using sterile technique and 2% Lidocaine as local anesthetic, under stereotactic guidance, a 9 gauge vacuum-assisted device was used to perform core needle biopsy of the area of distortion of located within the right breast utilizing a superior to inferior craniocaudal approach. Specimen mammography was not performed as there were no calcifications.  At the conclusion of the procedure, a T-shaped tissue marker clip was deployed into the biopsy cavity.  Follow-up 2-view mammogram confirmed clip to be in appropriate position.  The usual time-out protocol was performed immediately prior to the procedure.  IMPRESSION: Stereotactic-guided biopsy of area of distortion within the right breast as discussed above.  No apparent complications.   Original Report Authenticated By: Rolla Plate, M.D.    ASSESSMENT: 52 y.o. [place] woman status post right breast biopsy 01/22/2013 for a clinical T1b NX invasive ductal carcinoma, grade 2, estrogen receptor 90% positive, progesterone receptor 90% positive, with no HER-2 amplification and an MIB-1 of 10%.  PLAN: We spent the better part of today's hour-long visit discussing the specifics of the patient's  situation. She understands if the enlarged lymph node we see in her axilla is positive for cancer, then she will definitely need chemotherapy. If the lymph node is negative we will send an Oncotype DX and depending on those results we will make the chemotherapy decision. Accordingly I have made Jean [name] a return appointment in approximately 5 weeks, by which time we will have all the necessary date.  She is concerned regarding weight reduction. I have placed a call to our dietitian to see how we can arrange for that. She also needs genetics counseling and I have requested a meeting with our counselor. I would expect we will have those results within 2 months.  Otherwise the plan is to start with breast conservation therapy, and then make a definite decision regarding chemotherapy. I she does need chemotherapy would be the next that, otherwise we will proceed to radiation. After that she will need 5-10 years of antiestrogen therapy. Jean [name] has a good understanding of all this. She knows to call for any problems that may develop before her next visit here.  Lowella Dell, MD   05/20/2013 9:23 AM

## 2013-05-23 ENCOUNTER — Telehealth: Payer: Self-pay | Admitting: Oncology

## 2013-05-23 NOTE — Telephone Encounter (Signed)
S/w the pt and she is aware of her  dec 2014 appts along with the bone density appt at [facility].

## 2013-05-27 ENCOUNTER — Telehealth: Payer: Self-pay | Admitting: *Deleted

## 2013-05-27 ENCOUNTER — Other Ambulatory Visit: Payer: Self-pay | Admitting: Oncology

## 2013-05-27 NOTE — Telephone Encounter (Signed)
Pt called wanting to inquire about " what are my survivial chances if I choose not to go on the AI "  Per phone discussion, Darnise stated she is very very concerned about the use of an aromatase inhibitor and " it dropping my estrogen level that it would interfere with my antidepressant ".  Pt's concern will be given to MD for review for appropriate discussion with pt for treatment decision.  Pt gave return call number as [phone].

## 2013-05-30 ENCOUNTER — Encounter: Payer: Self-pay | Admitting: Oncology

## 2013-05-30 NOTE — Progress Notes (Signed)
Per nurse this patient is disabled but not from breast cancer; prinicipal life insurance disability papers need to be filled out by another MD.

## 2013-06-04 ENCOUNTER — Other Ambulatory Visit: Payer: Self-pay | Admitting: *Deleted

## 2013-06-04 MED ORDER — ANASTROZOLE 1 MG PO TABS: 1.0000 mg | ORAL_TABLET | Freq: Every day | ORAL | Status: DC

## 2013-06-04 NOTE — Telephone Encounter (Signed)
Pt left message stating she received a letter from this office stating she needed to reschedule her appointment she failed to keep- pt was here on 9/22 and did not miss any appointments.  Pt also stated need for prescription for arimidex to be called to [facility] on [address] of [address]  This RN reviewed dictation on 9/22 as well as noted letter sent stating missed appointment. Per dictation MD recommended arimidex- with no noted prescription Escribed.  This RN sent prescription to pharmacy per MD.  Jeanene Erb pt and informed of above as well as unsure why she received letter regarding missed appointment.  No further needs at this time.

## 2013-06-05 ENCOUNTER — Ambulatory Visit (AMBULATORY_SURGERY_CENTER): Payer: Self-pay

## 2013-06-05 ENCOUNTER — Encounter: Payer: Self-pay | Admitting: Gastroenterology

## 2013-06-05 VITALS — Ht 64.0 in | Wt 250.0 lb

## 2013-06-05 DIAGNOSIS — Z1211 Encounter for screening for malignant neoplasm of colon: Secondary | ICD-10-CM

## 2013-06-05 MED ORDER — SUPREP BOWEL PREP KIT 17.5-3.13-1.6 GM/177ML PO SOLN: 1.0000 | Freq: Once | ORAL | Status: DC

## 2013-06-11 ENCOUNTER — Ambulatory Visit: Payer: BC Managed Care – PPO | Admitting: Radiation Oncology

## 2013-06-13 ENCOUNTER — Encounter: Payer: Self-pay | Admitting: *Deleted

## 2013-06-13 NOTE — Progress Notes (Signed)
Received disability papers on pt.  Gave to Val for her & Dr. Darnelle Catalan to fill our and or sign.

## 2013-06-19 ENCOUNTER — Encounter: Payer: Self-pay | Admitting: Gastroenterology

## 2013-06-19 ENCOUNTER — Ambulatory Visit (AMBULATORY_SURGERY_CENTER): Payer: BC Managed Care – PPO | Admitting: Gastroenterology

## 2013-06-19 VITALS — BP 103/76 | HR 69 | Temp 97.6°F | Resp 15 | Ht 64.0 in | Wt 250.0 lb

## 2013-06-19 DIAGNOSIS — Z1211 Encounter for screening for malignant neoplasm of colon: Secondary | ICD-10-CM

## 2013-06-19 MED ORDER — SODIUM CHLORIDE 0.9 % IV SOLN: 500.0000 mL | INTRAVENOUS | Status: DC

## 2013-06-19 NOTE — Patient Instructions (Signed)
Hemorrhoids seen, handout given. Repeat colonoscopy in 10 years. Resume current medications. Call us with any questions or concerns. Thank you!!  YOU HAD AN ENDOSCOPIC PROCEDURE TODAY AT THE [facility]: Refer to the procedure report that was given to you for any specific questions about what was found during the examination.  If the procedure report does not answer your questions, please call your gastroenterologist to clarify.  If you requested that your care partner not be given the details of your procedure findings, then the procedure report has been included in a sealed envelope for you to review at your convenience later.  YOU SHOULD EXPECT: Some feelings of bloating in the abdomen. Passage of more gas than usual.  Walking can help get rid of the air that was put into your GI tract during the procedure and reduce the bloating. If you had a lower endoscopy (such as a colonoscopy or flexible sigmoidoscopy) you may notice spotting of blood in your stool or on the toilet paper. If you underwent a bowel prep for your procedure, then you may not have a normal bowel movement for a few days.  DIET: Your first meal following the procedure should be a light meal and then it is ok to progress to your normal diet.  A half-sandwich or bowl of soup is an example of a good first meal.  Heavy or fried foods are harder to digest and may make you feel nauseous or bloated.  Likewise meals heavy in dairy and vegetables can cause extra gas to form and this can also increase the bloating.  Drink plenty of fluids but you should avoid alcoholic beverages for 24 hours.  ACTIVITY: Your care partner should take you home directly after the procedure.  You should plan to take it easy, moving slowly for the rest of the day.  You can resume normal activity the day after the procedure however you should NOT DRIVE or use heavy machinery for 24 hours (because of the sedation medicines used during the test).    SYMPTOMS  TO REPORT IMMEDIATELY: A gastroenterologist can be reached at any hour.  During normal business hours, 8:30 AM to 5:00 PM Monday through Friday, call [phone].  After hours and on weekends, please call the GI answering service at [phone] who will take a message and have the physician on call contact you.   Following lower endoscopy (colonoscopy or flexible sigmoidoscopy):  Excessive amounts of blood in the stool  Significant tenderness or worsening of abdominal pains  Swelling of the abdomen that is new, acute  Fever of 100F or higher  Following upper endoscopy (EGD)  Vomiting of blood or coffee ground material  New chest pain or pain under the shoulder blades  Painful or persistently difficult swallowing  New shortness of breath  Fever of 100F or higher  Black, tarry-looking stools  FOLLOW UP: If any biopsies were taken you will be contacted by phone or by letter within the next 1-3 weeks.  Call your gastroenterologist if you have not heard about the biopsies in 3 weeks.  Our staff will call the home number listed on your records the next business day following your procedure to check on you and address any questions or concerns that you may have at that time regarding the information given to you following your procedure. This is a courtesy call and so if there is no answer at the home number and we have not heard from you through the emergency physician  on call, we will assume that you have returned to your regular daily activities without incident.  SIGNATURES/CONFIDENTIALITY: You and/or your care partner have signed paperwork which will be entered into your electronic medical record.  These signatures attest to the fact that that the information above on your After Visit Summary has been reviewed and is understood.  Full responsibility of the confidentiality of this discharge information lies with you and/or your care-partner.

## 2013-06-19 NOTE — Op Note (Signed)
[facility] [address] [place], [address]   COLONOSCOPY PROCEDURE REPORT  PATIENT: Jean, [name]  MR#: [id number] BIRTHDATE: May 10, 1961 , 52  yrs. old GENDER: Female ENDOSCOPIST: Louis Meckel, MD REFERRED MW:[id number] Jacky Kindle, M.D. PROCEDURE DATE:  06/19/2013 PROCEDURE:   Colonoscopy, screening First Screening Colonoscopy - Avg.  risk and is 50 yrs.  old or older Yes.  Prior Negative Screening - Now for repeat screening. N/A  History of Adenoma - Now for follow-up colonoscopy & has been > or = to 3 yrs.  N/A  Polyps Removed Today? No.  Recommend repeat exam, <10 yrs? No. ASA CLASS:   Class II INDICATIONS:average risk screening. MEDICATIONS: MAC sedation, administered by CRNA and propofol (Diprivan) 350mg  IV  DESCRIPTION OF PROCEDURE:   After the risks benefits and alternatives of the procedure were thoroughly explained, informed consent was obtained.       The LB [id number]  endoscope was introduced through the anus and advanced to the cecum, which was identified by both the appendix and ileocecal valve. No adverse events experienced.   The quality of the prep was Suprep poor  The instrument was then slowly withdrawn as the colon was fully examined.      COLON FINDINGS: Internal hemorrhoids were found.   The colon was otherwise normal.  There was no diverticulosis, inflammation, polyps or cancers unless previously stated.  Retroflexed views revealed no abnormalities. The time to cecum=3 minutes 39 seconds. Withdrawal time=12 minutes 12 seconds.  The scope was withdrawn and the procedure completed. COMPLICATIONS: There were no complications.  ENDOSCOPIC IMPRESSION: 1.   Internal hemorrhoids 2.   The colon was otherwise normal  RECOMMENDATIONS: Continue current colorectal screening recommendations for "routine risk" patients with a repeat colonoscopy in 10 years.   eSigned:  Louis Meckel, MD 06/19/2013 9:46  AM   cc:   PATIENT NAME:  Jean, [name] MR#: [id number]

## 2013-06-19 NOTE — Progress Notes (Signed)
Reportr to pacu rn, vss, bbs=clear 

## 2013-06-19 NOTE — Progress Notes (Signed)
Patient did not experience any of the following events: a burn prior to discharge; a fall within the facility; wrong site/side/patient/procedure/implant event; or a hospital transfer or hospital admission upon discharge from the facility. (G8907) Patient did not have preoperative order for IV antibiotic SSI prophylaxis. (G8918)  

## 2013-06-20 ENCOUNTER — Encounter: Payer: Self-pay | Admitting: Oncology

## 2013-06-20 ENCOUNTER — Telehealth: Payer: Self-pay | Admitting: *Deleted

## 2013-06-20 NOTE — Progress Notes (Signed)
Per MD patient is not disabled from cancer needs to have pcp fill out disability papers.

## 2013-06-20 NOTE — Telephone Encounter (Signed)
  Follow up Call-  Call back number 06/19/2013  Post procedure Call Back phone  # [phone]  Permission to leave phone message Yes     Patient questions:  Do you have a fever, pain , or abdominal swelling? no Pain Score  0 *  Have you tolerated food without any problems? yes  Have you been able to return to your normal activities? yes  Do you have any questions about your discharge instructions: Diet   no Medications  no Follow up visit  no  Do you have questions or concerns about your Care? no  Actions: * If pain score is 4 or above: No action needed, pain <4.

## 2013-06-24 ENCOUNTER — Telehealth: Payer: Self-pay | Admitting: *Deleted

## 2013-06-24 NOTE — Telephone Encounter (Signed)
Angelique Blonder called about disability papers and I informed her per Ebony's note that she needs to go to pts PCP for these forms to be filled out.

## 2013-06-27 ENCOUNTER — Encounter: Payer: Self-pay | Admitting: *Deleted

## 2013-06-27 DIAGNOSIS — Z923 Personal history of irradiation: Secondary | ICD-10-CM | POA: Insufficient documentation

## 2013-07-02 ENCOUNTER — Encounter: Payer: Self-pay | Admitting: Radiation Oncology

## 2013-07-02 ENCOUNTER — Ambulatory Visit
Admission: RE | Admit: 2013-07-02 | Discharge: 2013-07-02 | Disposition: A | Discharge status: Home / Self Care | Payer: BC Managed Care – PPO | Source: Ambulatory Visit | Attending: Radiation Oncology | Admitting: Radiation Oncology

## 2013-07-02 VITALS — BP 137/84 | HR 81 | Temp 99.0°F | Resp 20 | Wt 262.0 lb

## 2013-07-02 DIAGNOSIS — C50211 Malignant neoplasm of upper-inner quadrant of right female breast: Secondary | ICD-10-CM

## 2013-07-02 NOTE — Progress Notes (Signed)
CC: Dr. Abigail Miyamoto, Dr. Geoffry Paradise  Followup note:  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 She is without complaints today. She is pleased with her cosmesis. She is on adjuvant Arimidex through Dr. Darnelle Catalan. 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 She canceled her followup appointment with Dr. Abigail Miyamoto. She has not had any further vertigo.  Physical examination: Alert and oriented. Filed Vitals:   07/02/13 1511  BP: 137/84  Pulse: 81  Temp: 99 F (37.2 C)  Resp: 20   Head and neck examination: Grossly unremarkable. Nodes: Without palpable cervical, supraclavicular, or axillary lymphadenopathy. Chest: Lungs clear. Breasts: There is slight residual erythema along the right breast with a slight peau d' orange appearance representing mild breast edema. Cosmesis is satisfactory. Left breast without masses or lesions.  Impression: Satisfactory progress. She will see Dr. Darnelle Catalan for a followup visit on December 18. She typically has annual mammography in May, I think she can wait until May to have a baseline diagnostic mammogram on the right and a screening mammogram on the left. This can be scheduled by Dr. Darnelle Catalan who will be following her at regular intervals.  Plan: As above. Followup mammography scheduling through Dr. Darnelle Catalan.

## 2013-07-02 NOTE — Progress Notes (Signed)
Pt denies pain, fatigue, loss of appetite. She states her skin of right breast is healed but occasionally itchy. She states she is not applying any lotion. Advised she apply lotion to help w/itchiness. Pt taking Arimidex.

## 2013-07-04 ENCOUNTER — Other Ambulatory Visit: Payer: Self-pay

## 2013-07-09 ENCOUNTER — Ambulatory Visit
Admission: RE | Admit: 2013-07-09 | Discharge: 2013-07-09 | Disposition: A | Discharge status: Home / Self Care | Payer: BC Managed Care – PPO | Source: Ambulatory Visit | Attending: Oncology | Admitting: Oncology

## 2013-07-09 DIAGNOSIS — C50211 Malignant neoplasm of upper-inner quadrant of right female breast: Secondary | ICD-10-CM

## 2013-07-24 ENCOUNTER — Telehealth: Payer: Self-pay | Admitting: Oncology

## 2013-07-24 NOTE — Telephone Encounter (Signed)
Faxed pt medical records to [facility]

## 2013-08-14 ENCOUNTER — Other Ambulatory Visit: Payer: Self-pay | Admitting: Physician Assistant

## 2013-08-14 DIAGNOSIS — C50211 Malignant neoplasm of upper-inner quadrant of right female breast: Secondary | ICD-10-CM

## 2013-08-15 ENCOUNTER — Ambulatory Visit (HOSPITAL_BASED_OUTPATIENT_CLINIC_OR_DEPARTMENT_OTHER): Payer: BC Managed Care – PPO | Admitting: Oncology

## 2013-08-15 ENCOUNTER — Other Ambulatory Visit (HOSPITAL_BASED_OUTPATIENT_CLINIC_OR_DEPARTMENT_OTHER): Payer: BC Managed Care – PPO

## 2013-08-15 VITALS — BP 135/84 | HR 76 | Temp 98.0°F | Resp 18 | Ht 64.0 in | Wt 267.7 lb

## 2013-08-15 DIAGNOSIS — C50219 Malignant neoplasm of upper-inner quadrant of unspecified female breast: Secondary | ICD-10-CM

## 2013-08-15 DIAGNOSIS — C50211 Malignant neoplasm of upper-inner quadrant of right female breast: Secondary | ICD-10-CM

## 2013-08-15 DIAGNOSIS — Z170 Estrogen receptor positive status [ER+]: Secondary | ICD-10-CM

## 2013-08-15 LAB — COMPREHENSIVE METABOLIC PANEL (CC13)
ALT: 24 U/L (ref 0–55)
AST: 19 U/L (ref 5–34)
Albumin: 3.6 g/dL (ref 3.5–5.0)
Anion Gap: 8 mEq/L (ref 3–11)
CO2: 28 mEq/L (ref 22–29)
Calcium: 9.5 mg/dL (ref 8.4–10.4)
Creatinine: 0.8 mg/dL (ref 0.6–1.1)
Potassium: 4.6 mEq/L (ref 3.5–5.1)
Sodium: 141 mEq/L (ref 136–145)
Total Bilirubin: 0.43 mg/dL (ref 0.20–1.20)
Total Protein: 7.4 g/dL (ref 6.4–8.3)

## 2013-08-15 LAB — CBC WITH DIFFERENTIAL/PLATELET
BASO%: 0.2 % (ref 0.0–2.0)
EOS%: 9 % — ABNORMAL HIGH (ref 0.0–7.0)
LYMPH%: 22.9 % (ref 14.0–49.7)
MCH: 29 pg (ref 25.1–34.0)
MCHC: 32.9 g/dL (ref 31.5–36.0)
MONO#: 0.3 10*3/uL (ref 0.1–0.9)
NEUT%: 59.4 % (ref 38.4–76.8)
Platelets: 180 10*3/uL (ref 145–400)
RBC: 4.17 10*6/uL (ref 3.70–5.45)
RDW: 13.6 % (ref 11.2–14.5)
WBC: 4 10*3/uL (ref 3.9–10.3)
lymph#: 0.9 10*3/uL (ref 0.9–3.3)

## 2013-08-15 NOTE — Progress Notes (Signed)
ID: [name] OB: 1961-08-04  MR#: [id number]  MWN#:[id number]  PCP: Minda Meo, MD GYN:  Debbora Dus NP SU: Abigail Miyamoto OTHER MD: Chipper Herb, Rupinder Tiney Rouge   HISTORY OF PRESENT ILLNESS: "Jean [name]" had routine mammographic screening 10/02/2012 showing dense breasts (category C.). A possible distortion was noted in the right breast and a diagnostic right mammography and ultrasonography on 01/02/2013 confirmed a persistent area of distortion in the upper inner quadrant of the right breast measuring approximately 1 cm. This was not palpable. Ultrasound was not informative and showed no axillary adenopathy. 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  Bilateral breast MRIs 01/28/2013 showed a large hematoma at the biopsy site, measuring up to 6.1 cm, with minimal patchy enhancing nodularity at the periphery. There was a 1.7 cm lymph node in the right axillary region with a preserved fatty hilum but slightly thickened cortex. There were no other findings of concern.  The patient's subsequent history is as detailed below  INTERVAL HISTORY: Jean [name] returns today for followup of her breast cancer. Since her last visit here she married and now she is the official step mom of 4 daughters aged 37, 76, 104 and 52. She is living in [place]. Her mother still lives in [place]. She comes here regularly to visit.  REV is tolerating the anastrozole with no significant side effects. She does have some vaginal dryness, but they noted you with that. Hot flashes are minimal. A detailed review of systems today was otherwise entirely negative.  PAST MEDICAL HISTORY: Past Medical History  Diagnosis Date  . Vertigo     pt doesn't take any medication  . Pulmonary embolism 2012    takes Xarelto daily  .  Obesity   . Breast cancer   . Hyperlipidemia     takes Lipitor daily  . PUD (peptic ulcer disease)     remote  . COPD with asthma   . Nasal congestion     takes Sudafed if needed)  . Depression     takes Viibryd daily as well as Abilify  . Pneumonia     hx of;last time around 1987  . Arthritis   . Joint pain   . GERD (gastroesophageal reflux disease)     related to Xarelto;takes Omeprazole daily  . Stress incontinence   . Nocturia   . Hx of radiation therapy 03/25/13- 05/06/13    right breast 4500 cGy 25 sessions, right breast boost 1000 cGy 5 sessions    PAST SURGICAL HISTORY: Past Surgical History  Procedure Laterality Date  . Adenoidectomy  1967  . Breast biopsy  May 2014    stage I beast cancer  . Breast lumpectomy with needle localization and axillary sentinel lymph node bx Right 02/07/2013    Procedure: BREAST LUMPECTOMY WITH NEEDLE LOCALIZATION AND AXILLARY SENTINEL LYMPH NODE BX;  Surgeon: Shelly Rubenstein, MD;  Location: [facility] OR;  Service: General;  Laterality: Right;    FAMILY HISTORY Family History  Problem Relation Age of Onset  . Adopted: Yes  . Breast cancer Maternal Aunt 60  . Breast cancer Maternal Grandmother     post menopausal breast cancer  . Breast cancer Cousin 40    possible bilateral cancer  . Stomach cancer Neg Hx   . Pancreatic cancer Neg Hx   . Colon cancer Neg Hx  the patient is adopted and has no information on her father's side of the family. Her biological mother is currently 29. Her biological mothers mother had a diagnosis of breast cancer but the patient does not know at what age. Also 1 maternal aunt was diagnosed with breast cancer as well as a first cousin, and a first cousin was diagnosed at age 60. The patient was tested for the BRCA mutation and is negative.  GYNECOLOGIC HISTORY:  Menarche age 74, perimenopause at around age 56. The patient then took hormone replacement for 5 years, quitting at age 55 when she developed a pulmonary  embolus. She never took birth control pills. She is GX P0.  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 In [place] she visits her adoptive mother Jean [name] and her adoptive brother [name]. The patient's adoptive father is currently at [facility] following orthopedic surgery. There is a second adoptive brother. The patient herself is single, but married her partner in 2014 and moved in with her and her  4 children in [place]. She wishes to maintain her medical care here since her mother will not be moving and she will be coming to this area frequently.. The only pet at home is a Estate manager/land agent    ADVANCED DIRECTIVES: In place. The patient has named her adoptive mother, Jean [name], as her healthcare power of attorney. Barbara's cell number is [phone]   HEALTH MAINTENANCE: History  Substance Use Topics  . Smoking status: Never Smoker   . Smokeless tobacco: Never Used  . Alcohol Use: No     Colonoscopy:  PAP: 2014  Bone density:  Lipid panel:  Allergies  Allergen Reactions  . Codeine Shortness Of Breath    Current Outpatient Prescriptions  Medication Sig Dispense Refill  . anastrozole (ARIMIDEX) 1 MG tablet Take 1 tablet (1 mg total) by mouth daily.  30 tablet  12  . ARIPiprazole (ABILIFY) 5 MG tablet Take 5 mg by mouth daily. 2.5 mg daily      . atorvastatin (LIPITOR) 20 MG tablet Take 20 mg by mouth daily.      . celecoxib (CELEBREX) 200 MG capsule Take 200 mg by mouth 2 (two) times daily.      Marland Kitchen ibuprofen (ADVIL,MOTRIN) 800 MG tablet       . omeprazole (PRILOSEC) 20 MG capsule Take 20 mg by mouth as needed.       . Vilazodone HCl (VIIBRYD) 40 MG TABS Take by mouth daily.      Marland Kitchen zolpidem (AMBIEN) 5 MG tablet        No current facility-administered medications for this visit.    OBJECTIVE: Middle-aged white woman in no acute distres0 Filed Vitals:   08/15/13 1100  BP: 135/84  Pulse: 76  Temp: 98 F (36.7 C)  Resp: 18      Body mass index is 45.93 kg/(m^2).    ECOG FS: 1  Sclerae unicteric, pupils equal and reactive Oropharynx clear, good dentition No cervical or supraclavicular adenopathy Lungs no rales or rhonchi Heart regular rate and rhythm Abd obese, benign MSK no focal spinal tenderness Neuro: non-focal, well-oriented, positive affect Breasts: The right breast is status post surgery and radiation. There is a minimal remaining blush, the pores her a little bit more prominent than on the left, and of course there is slight induration below the skin scar. All of this is expected. There is no evidence of disease recurrence. The right axilla  is benign. The left breast is unremarkable.  LAB RESULTS:  CMP     Component Value Date/Time   NA 140 02/06/2013 0828   NA 141 01/30/2013 0823   K 3.8 02/06/2013 0828   K 4.1 01/30/2013 0823   CL 105 02/06/2013 0828   CL 105 01/30/2013 0823   CO2 26 02/06/2013 0828   CO2 27 01/30/2013 0823   GLUCOSE 133* 02/06/2013 0828   GLUCOSE 104* 01/30/2013 0823   BUN 16 02/06/2013 0828   BUN 14.8 01/30/2013 0823   CREATININE 0.84 02/06/2013 0828   CREATININE 0.8 01/30/2013 0823   CALCIUM 9.4 02/06/2013 0828   CALCIUM 9.4 01/30/2013 0823   PROT 7.0 01/30/2013 0823   ALBUMIN 3.3* 01/30/2013 0823   AST 18 01/30/2013 0823   ALT 23 01/30/2013 0823   ALKPHOS 99 01/30/2013 0823   BILITOT 0.61 01/30/2013 0823   GFRNONAA 80* 02/06/2013 0828   GFRAA >90 02/06/2013 0828    I No results found for this basename: SPEP,  UPEP,   kappa and lambda light chains    Lab Results  Component Value Date   WBC 4.0 08/15/2013   NEUTROABS 2.4 08/15/2013   HGB 12.1 08/15/2013   HCT 36.8 08/15/2013   MCV 88.2 08/15/2013   PLT 180 08/15/2013      Chemistry      Component Value Date/Time   NA 140 02/06/2013 0828   NA 141 01/30/2013 0823   K 3.8 02/06/2013 0828   K 4.1 01/30/2013 0823   CL 105 02/06/2013 0828   CL 105 01/30/2013 0823   CO2 26 02/06/2013 0828   CO2 27 01/30/2013 0823   BUN 16 02/06/2013 0828   BUN 14.8  01/30/2013 0823   CREATININE 0.84 02/06/2013 0828   CREATININE 0.8 01/30/2013 0823      Component Value Date/Time   CALCIUM 9.4 02/06/2013 0828   CALCIUM 9.4 01/30/2013 0823   ALKPHOS 99 01/30/2013 0823   AST 18 01/30/2013 0823   ALT 23 01/30/2013 0823   BILITOT 0.61 01/30/2013 0823       No results found for this basename: LABCA2    No components found with this basename: LABCA125    No results found for this basename: INR,  in the last 168 hours  Urinalysis No results found for this basename: colorurine,  appearanceur,  labspec,  phurine,  glucoseu,  hgbur,  bilirubinur,  ketonesur,  proteinur,  urobilinogen,  nitrite,  leukocytesur    STUDIES: EXAM:  DUAL X-RAY ABSORPTIOMETRY (DXA) FOR BONE MINERAL DENSITY NOV 2014 COMPARISON: None  FINDINGS:  AP LUMBAR SPINE  Bone Mineral Density (BMD): 1.001 g/cm2  Young Adult T Score: -0.2  Z Score: 0.6  Left FEMUR neck  Bone Mineral Density (BMD): 0.782 g/cm2  Young Adult T Score: -0.6  Z Score: 0.2  ASSESSMENT: Patient's diagnostic category is normal by WHO Criteria.  FRACTURE RISK: Not increased  FRAX:  World Health Organization FRAX assessment of absolute fracture risk  is not calculated for this patient because the patient has T-scores  at or by of -1.0.    ASSESSMENT: 52 y.o. BRCA negative [place] woman status post right breast biopsy 01/22/2013 for a clinical T1b NX, stage IA invasive ductal carcinoma, grade 2, estrogen receptor 90% positive, progesterone receptor 90% positive, with no HER-2 amplification and an MIB-1 of 10%.  (1) Status post right lumpectomy and sentinel lymph node sampling 02/07/2013 for a pT1b pN0, stage IA invasive ductal breast cancer, grade 2, repeat  HER-2 again negative  (2) Oncotype score of 11 predicts a distant recurrence rate within 10 years of 8% if the patient's only systemic treatment is tamoxifen for 5 years  (3) adjuvant radiation completed 04/30/2013  (4) anastrozole started October 2014;  normal bone density November 2014    PLAN: Apparently there is an Best boy in [place] and I let the patient have that information, however she would like to continue to be followed here since she will be visiting her mother on a regular basis.  Accordingly I am scheduling her for mammography as well as lab work and a visit with me 01/13/2014. We will then see her 6 months after that and continue to see her on an every 6 month basis until she completes her 5 years on anastrozole.  She has a good understanding of this plan, and agrees with it. She notes a goal of treatment is cure. She will call with any problems that may develop before next visit here.  Lowella Dell, MD   08/15/2013 11:12 AM

## 2013-09-02 ENCOUNTER — Telehealth: Payer: Self-pay | Admitting: Oncology

## 2013-09-02 NOTE — Telephone Encounter (Signed)
Faxed pt medical records to [facility].

## 2013-11-14 ENCOUNTER — Other Ambulatory Visit: Payer: Self-pay | Admitting: *Deleted

## 2013-11-14 MED ORDER — ANASTROZOLE 1 MG PO TABS: 1.0000 mg | ORAL_TABLET | Freq: Every day | ORAL | Status: DC

## 2013-12-23 ENCOUNTER — Telehealth: Payer: Self-pay | Admitting: Oncology

## 2013-12-23 NOTE — Telephone Encounter (Signed)
, °

## 2014-01-13 ENCOUNTER — Other Ambulatory Visit: Payer: BC Managed Care – PPO

## 2014-01-13 ENCOUNTER — Ambulatory Visit: Payer: BC Managed Care – PPO | Admitting: Oncology

## 2014-03-10 ENCOUNTER — Ambulatory Visit
Admission: RE | Admit: 2014-03-10 | Discharge: 2014-03-10 | Disposition: A | Discharge status: Home / Self Care | Payer: BC Managed Care – PPO | Source: Ambulatory Visit | Attending: Oncology | Admitting: Oncology

## 2014-03-10 ENCOUNTER — Ambulatory Visit (HOSPITAL_BASED_OUTPATIENT_CLINIC_OR_DEPARTMENT_OTHER): Payer: BC Managed Care – PPO | Admitting: Oncology

## 2014-03-10 ENCOUNTER — Other Ambulatory Visit (HOSPITAL_BASED_OUTPATIENT_CLINIC_OR_DEPARTMENT_OTHER): Payer: BC Managed Care – PPO

## 2014-03-10 VITALS — BP 134/82 | HR 77 | Temp 98.2°F | Resp 20 | Ht 64.0 in | Wt 272.8 lb

## 2014-03-10 DIAGNOSIS — C50211 Malignant neoplasm of upper-inner quadrant of right female breast: Secondary | ICD-10-CM

## 2014-03-10 DIAGNOSIS — Z170 Estrogen receptor positive status [ER+]: Secondary | ICD-10-CM

## 2014-03-10 DIAGNOSIS — C50219 Malignant neoplasm of upper-inner quadrant of unspecified female breast: Secondary | ICD-10-CM

## 2014-03-10 DIAGNOSIS — IMO0001 Reserved for inherently not codable concepts without codable children: Secondary | ICD-10-CM

## 2014-03-10 LAB — CBC WITH DIFFERENTIAL/PLATELET
BASO%: 0.7 % (ref 0.0–2.0)
Basophils Absolute: 0 10*3/uL (ref 0.0–0.1)
EOS ABS: 0.2 10*3/uL (ref 0.0–0.5)
EOS%: 4.8 % (ref 0.0–7.0)
HCT: 36.2 % (ref 34.8–46.6)
HGB: 12 g/dL (ref 11.6–15.9)
LYMPH%: 21.6 % (ref 14.0–49.7)
MCH: 29.8 pg (ref 25.1–34.0)
MCHC: 33.2 g/dL (ref 31.5–36.0)
MCV: 89.8 fL (ref 79.5–101.0)
MONO#: 0.3 10*3/uL (ref 0.1–0.9)
MONO%: 7.6 % (ref 0.0–14.0)
NEUT%: 65.3 % (ref 38.4–76.8)
NEUTROS ABS: 2.9 10*3/uL (ref 1.5–6.5)
Platelets: 210 10*3/uL (ref 145–400)
RBC: 4.03 10*6/uL (ref 3.70–5.45)
RDW: 13.4 % (ref 11.2–14.5)
WBC: 4.4 10*3/uL (ref 3.9–10.3)
lymph#: 0.9 10*3/uL (ref 0.9–3.3)

## 2014-03-10 LAB — COMPREHENSIVE METABOLIC PANEL (CC13)
ALBUMIN: 3.3 g/dL — AB (ref 3.5–5.0)
ALK PHOS: 92 U/L (ref 40–150)
ALT: 22 U/L (ref 0–55)
ANION GAP: 9 meq/L (ref 3–11)
AST: 15 U/L (ref 5–34)
BUN: 14.2 mg/dL (ref 7.0–26.0)
CO2: 25 meq/L (ref 22–29)
Calcium: 9.4 mg/dL (ref 8.4–10.4)
Chloride: 106 mEq/L (ref 98–109)
Creatinine: 0.9 mg/dL (ref 0.6–1.1)
GLUCOSE: 190 mg/dL — AB (ref 70–140)
POTASSIUM: 4.1 meq/L (ref 3.5–5.1)
SODIUM: 141 meq/L (ref 136–145)
TOTAL PROTEIN: 6.8 g/dL (ref 6.4–8.3)
Total Bilirubin: 0.49 mg/dL (ref 0.20–1.20)

## 2014-03-10 MED ORDER — ANASTROZOLE 1 MG PO TABS: 1.0000 mg | ORAL_TABLET | Freq: Every day | ORAL | Status: DC

## 2014-03-10 NOTE — Addendum Note (Signed)
Addended by: Amelia Jo I on: 03/10/2014 05:30 PM   Modules accepted: Orders

## 2014-03-10 NOTE — Progress Notes (Signed)
ID: Stephens Shire OB: [birth year]  MR#: [id number]  PYK#:[id number]  PCP: Geoffery Lyons, MD GYN:  Laurin Coder NP SU: Coralie Keens OTHER MD: Arloa Koh, Rupinder Angelina Ok  CHIEF COMPLAINT: Early stage breast cancer  CURRENT TREATMENT: Antiestrogen therapy   HISTORY OF PRESENT ILLNESS: From the original intake note:  "Jean [name]" had routine mammographic screening 10/02/2012 showing dense breasts (category C.). A possible distortion was noted in the right breast and a diagnostic right mammography and ultrasonography on 01/02/2013 confirmed a persistent area of distortion in the upper inner quadrant of the right breast measuring approximately 1 cm. This was not palpable. Ultrasound was not informative and showed no axillary adenopathy. 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  Bilateral breast MRIs 01/28/2013 showed a large hematoma at the biopsy site, measuring up to 6.1 cm, with minimal patchy enhancing nodularity at the periphery. There was a 1.7 cm lymph node in the right axillary region with a preserved fatty hilum but slightly thickened cortex. There were no other findings of concern.  The patient's subsequent history is as detailed below  INTERVAL HISTORY: Jean [name] returns today for followup of her breast cancer. Although I understood she had been married previously, she tells me she is going to be "really" married, meaning have a formal wedding, June of 2016. In the meantime she continues to live with her partner and together of a have 4 daughters aged  04/30/2024. The patient continues to live in [place]. Her  adoptive mother still lives in [place] As does the rest of her adoptive family. Unfortunately her adoptive father is currently in a nursing home. Because the patient comes here  regularly to visit She will like to remain under our care..  ROS: She does have intermittent aches and pains, which however completely clear at times, so there very unlikely to be related to the anastrozole. They also do not seem to be related to her work. 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 She does not have a fixed route. She sleeps in the truck. She is "super caffeinated" in this doesn't help things in general. Basically she doesn't have a place or time where she can easily exercise for 15-30 minutes at a time. She is tolerating the Rivaroxaban without any bleeding complications or other side effects that she is aware of. She does have significant lower extremity edema, and she uses compression stockings as much as she can. A detailed review of systems today was otherwise stable  PAST MEDICAL HISTORY: Past Medical History  Diagnosis Date  . Vertigo     pt doesn't take any medication  . Pulmonary embolism 2012    takes Xarelto daily  . Obesity   . Breast cancer   . Hyperlipidemia     takes Lipitor daily  . PUD (peptic ulcer disease)     remote  . COPD with asthma   . Nasal congestion     takes Sudafed if needed)  . Depression     takes Viibryd daily as well as Abilify  . Pneumonia     hx of;last time around 1987  . Arthritis   . Joint pain   . GERD (gastroesophageal reflux disease)     related to Xarelto;takes Omeprazole daily  . Stress incontinence   . Nocturia   . Hx of  radiation therapy 03/25/13- 05/06/13    right breast 4500 cGy 25 sessions, right breast boost 1000 cGy 5 sessions    PAST SURGICAL HISTORY: Past Surgical History  Procedure Laterality Date  . Adenoidectomy  1967  . Breast biopsy  May 2014    stage I beast cancer  . Breast lumpectomy with needle localization and axillary sentinel lymph node bx Right 02/07/2013    Procedure: BREAST LUMPECTOMY WITH NEEDLE LOCALIZATION AND AXILLARY SENTINEL LYMPH NODE BX;  Surgeon: Harl Bowie, MD;   Location: [facility];  Service: General;  Laterality: Right;    FAMILY HISTORY Family History  Problem Relation Age of Onset  . Adopted: Yes  . Breast cancer Maternal Aunt 60  . Breast cancer Maternal Grandmother     post menopausal breast cancer  . Breast cancer Cousin 40    possible bilateral cancer  . Stomach cancer Neg Hx   . Pancreatic cancer Neg Hx   . Colon cancer Neg Hx    the patient is adopted and has no information on her father's side of the family. Her biological mother is currently 25. Her biological mothers mother had a diagnosis of breast cancer but the patient does not know at what age. Also 1 maternal aunt was diagnosed with breast cancer as well as a first cousin, and a first cousin was diagnosed at age 32. The patient was tested for the BRCA mutation and is negative.  GYNECOLOGIC HISTORY:  Menarche age 48, perimenopause at around age 32. The patient then took hormone replacement for 5 years, quitting at age 57 when she developed a pulmonary embolus. She never took birth control pills. She is GX P0.  SOCIAL HISTORY:  Jean [name] works as a Administrator, "irregular afraid". In [place] she visits her adoptive mother Pamala Hurry and her adoptive brother Nicki Reaper. The patient's adoptive father is currently at [facility] following orthopedic surgery. There is a second adoptive brother. The patient herself is single, but married her partner in 2014 and moved in with her and her  4 children in [place]. She wishes to maintain her medical care here since her mother will not be moving and she will be coming to this area frequently.. The only pet at home is a Neurosurgeon    ADVANCED DIRECTIVES: In place. The patient has named her adoptive mother, Pamala Hurry, as her healthcare power of attorney. Barbara's cell number is [phone]   HEALTH MAINTENANCE: History  Substance Use Topics  . Smoking status: Never Smoker   . Smokeless tobacco: Never Used  . Alcohol Use: No      Colonoscopy:  PAP: 2014  Bone density:  Lipid panel:  Allergies  Allergen Reactions  . Codeine Shortness Of Breath    Current Outpatient Prescriptions  Medication Sig Dispense Refill  . anastrozole (ARIMIDEX) 1 MG tablet Take 1 tablet (1 mg total) by mouth daily.  90 tablet  12  . ARIPiprazole (ABILIFY) 5 MG tablet Take 5 mg by mouth daily. 2.5 mg daily      . atorvastatin (LIPITOR) 20 MG tablet Take 20 mg by mouth daily.      . celecoxib (CELEBREX) 200 MG capsule Take 200 mg by mouth 2 (two) times daily.      Marland Kitchen ibuprofen (ADVIL,MOTRIN) 800 MG tablet       . omeprazole (PRILOSEC) 20 MG capsule Take 20 mg by mouth as needed.       . Vilazodone HCl (VIIBRYD) 40 MG TABS Take by mouth daily.      Marland Kitchen  zolpidem (AMBIEN) 5 MG tablet        No current facility-administered medications for this visit.    OBJECTIVE: Middle-aged white woman who appears stated age 53 Vitals:   03/10/14 0931  BP: 134/82  Pulse: 77  Temp: 98.2 F (36.8 C)  Resp: 20     Body mass index is 46.8 kg/(m^2).    ECOG FS: 1  Sclerae unicteric, pupils equal and reactive Oropharynx clear, teeth in good repair No cervical or supraclavicular adenopathy Lungs no rales or rhonchi Heart regular rate and rhythm Abd obese, nontender, positive bowel sounds MSK no focal spinal tenderness; chronic bilateral lower extremity lymphedema Neuro: non-focal, well-oriented, positive affect Breasts: The right breast is status post surgery and radiation. There is no evidence of disease recurrence. The right axilla is benign. The left breast is unremarkable.  LAB RESULTS:  CMP     Component Value Date/Time   NA 141 08/15/2013 1041   NA 140 02/06/2013 0828   K 4.6 08/15/2013 1041   K 3.8 02/06/2013 0828   CL 105 02/06/2013 0828   CL 105 01/30/2013 0823   CO2 28 08/15/2013 1041   CO2 26 02/06/2013 0828   GLUCOSE 93 08/15/2013 1041   GLUCOSE 133* 02/06/2013 0828   GLUCOSE 104* 01/30/2013 0823   BUN 14.2 08/15/2013 1041    BUN 16 02/06/2013 0828   CREATININE 0.8 08/15/2013 1041   CREATININE 0.84 02/06/2013 0828   CALCIUM 9.5 08/15/2013 1041   CALCIUM 9.4 02/06/2013 0828   PROT 7.4 08/15/2013 1041   ALBUMIN 3.6 08/15/2013 1041   AST 19 08/15/2013 1041   ALT 24 08/15/2013 1041   ALKPHOS 112 08/15/2013 1041   BILITOT 0.43 08/15/2013 1041   GFRNONAA 80* 02/06/2013 0828   GFRAA >90 02/06/2013 0828    I No results found for this basename: SPEP,  UPEP,   kappa and lambda light chains    Lab Results  Component Value Date   WBC 4.4 03/10/2014   NEUTROABS 2.9 03/10/2014   HGB 12.0 03/10/2014   HCT 36.2 03/10/2014   MCV 89.8 03/10/2014   PLT 210 03/10/2014      Chemistry      Component Value Date/Time   NA 141 08/15/2013 1041   NA 140 02/06/2013 0828   K 4.6 08/15/2013 1041   K 3.8 02/06/2013 0828   CL 105 02/06/2013 0828   CL 105 01/30/2013 0823   CO2 28 08/15/2013 1041   CO2 26 02/06/2013 0828   BUN 14.2 08/15/2013 1041   BUN 16 02/06/2013 0828   CREATININE 0.8 08/15/2013 1041   CREATININE 0.84 02/06/2013 0828      Component Value Date/Time   CALCIUM 9.5 08/15/2013 1041   CALCIUM 9.4 02/06/2013 0828   ALKPHOS 112 08/15/2013 1041   AST 19 08/15/2013 1041   ALT 24 08/15/2013 1041   BILITOT 0.43 08/15/2013 1041       No results found for this basename: LABCA2    No components found with this basename: LABCA125    No results found for this basename: INR,  in the last 168 hours  Urinalysis No results found for this basename: colorurine,  appearanceur,  labspec,  phurine,  glucoseu,  hgbur,  bilirubinur,  ketonesur,  proteinur,  urobilinogen,  nitrite,  leukocytesur    STUDIES: EXAM:  DUAL X-RAY ABSORPTIOMETRY (DXA) FOR BONE MINERAL DENSITY NOV 2014 COMPARISON: None  FINDINGS:  AP LUMBAR SPINE  Bone Mineral Density (BMD): 1.001 g/cm2  Young Adult T  Score: -0.2  Z Score: 0.6  Left FEMUR neck  Bone Mineral Density (BMD): 0.782 g/cm2  Young Adult T Score: -0.6  Z Score: 0.2  ASSESSMENT:  Patient's diagnostic category is normal by WHO Criteria.  FRACTURE RISK: Not increased  FRAX:  World Health Organization FRAX assessment of absolute fracture risk  is not calculated for this patient because the patient has T-scores  at or by of -1.0.    ASSESSMENT: 53 y.o. BRCA negative [place] woman status post right breast biopsy 01/22/2013 for a clinical T1b NX, stage IA invasive ductal carcinoma, grade 2, estrogen receptor 90% positive, progesterone receptor 90% positive, with no HER-2 amplification and an MIB-1 of 10%.  (1) Status post right lumpectomy and sentinel lymph node sampling 02/07/2013 for a pT1b pN0, stage IA invasive ductal breast cancer, grade 2, repeat HER-2 again negative  (2) Oncotype score of 11 predicts a distant recurrence rate within 10 years of 8% if the patient's only systemic treatment is tamoxifen for 5 years  (3) adjuvant radiation completed 04/30/2013  (4) anastrozole started October 2014; normal bone density November 2014   PLAN: Jean [name] is tolerating the anastrozole without significant side effects and the plan is to continue that for 5 years.  She is having some aches and pains. I do not believe these are going to be related to the aromatase inhibitor therapy. She is just not exercising. It is very difficult for her to find time for exercising because of her job. Nevertheless I think 15-20 minutes twice a day would make an enormous difference to her she could possibly do it.  She has other issues including some nasal drainage, lower extremity chronic lymphedema, etc., and all those are handled through her primary care physician, whom she is seeing today. She is also having her mammography to.  Jean [name] has a good understanding of the overall plan. She agrees with it. She knows a goal of treatment in her cases cure. She will call with any problems that may develop before her next visit here.   Chauncey Cruel, MD   03/10/2014 10:07 AM

## 2014-03-11 ENCOUNTER — Telehealth: Payer: Self-pay | Admitting: Oncology

## 2014-03-11 NOTE — Telephone Encounter (Signed)
per pt req to mail once sch-printed & mailed

## 2014-06-13 ENCOUNTER — Other Ambulatory Visit: Payer: Self-pay

## 2014-06-23 ENCOUNTER — Other Ambulatory Visit: Payer: Self-pay | Admitting: Oncology

## 2014-07-15 ENCOUNTER — Other Ambulatory Visit: Payer: Self-pay | Admitting: Oncology

## 2014-08-13 ENCOUNTER — Telehealth: Payer: Self-pay | Admitting: Oncology

## 2014-08-13 NOTE — Telephone Encounter (Signed)
S/w pt advised appt chg from 1/21 (md pal) to 10/06/14 @ 1.15 with HF. Pt verbalized understanding.

## 2014-08-25 ENCOUNTER — Other Ambulatory Visit: Payer: Self-pay | Admitting: Oncology

## 2014-08-25 DIAGNOSIS — C50211 Malignant neoplasm of upper-inner quadrant of right female breast: Secondary | ICD-10-CM

## 2014-09-18 ENCOUNTER — Ambulatory Visit: Payer: BC Managed Care – PPO | Admitting: Oncology

## 2014-09-18 ENCOUNTER — Other Ambulatory Visit: Payer: BC Managed Care – PPO

## 2014-09-30 ENCOUNTER — Telehealth: Payer: Self-pay | Admitting: Nurse Practitioner

## 2014-09-30 NOTE — Telephone Encounter (Signed)
pt cld to r/s appt-gave pt r/s time & date-pt understood

## 2014-10-06 ENCOUNTER — Other Ambulatory Visit: Payer: BC Managed Care – PPO

## 2014-10-06 ENCOUNTER — Ambulatory Visit: Payer: BC Managed Care – PPO | Admitting: Nurse Practitioner

## 2014-10-14 ENCOUNTER — Other Ambulatory Visit: Payer: Self-pay | Admitting: *Deleted

## 2014-10-14 DIAGNOSIS — C50211 Malignant neoplasm of upper-inner quadrant of right female breast: Secondary | ICD-10-CM

## 2014-10-15 ENCOUNTER — Other Ambulatory Visit (HOSPITAL_BASED_OUTPATIENT_CLINIC_OR_DEPARTMENT_OTHER): Payer: Commercial Indemnity

## 2014-10-15 ENCOUNTER — Telehealth: Payer: Self-pay | Admitting: Nurse Practitioner

## 2014-10-15 ENCOUNTER — Ambulatory Visit (HOSPITAL_BASED_OUTPATIENT_CLINIC_OR_DEPARTMENT_OTHER): Payer: Commercial Indemnity | Admitting: Nurse Practitioner

## 2014-10-15 ENCOUNTER — Encounter: Payer: Self-pay | Admitting: Nurse Practitioner

## 2014-10-15 VITALS — BP 147/72 | HR 72 | Temp 98.2°F | Resp 18 | Ht 64.0 in | Wt 271.8 lb

## 2014-10-15 DIAGNOSIS — Z170 Estrogen receptor positive status [ER+]: Secondary | ICD-10-CM

## 2014-10-15 DIAGNOSIS — C50211 Malignant neoplasm of upper-inner quadrant of right female breast: Secondary | ICD-10-CM

## 2014-10-15 LAB — COMPREHENSIVE METABOLIC PANEL (CC13)
ALBUMIN: 3.5 g/dL (ref 3.5–5.0)
ALK PHOS: 102 U/L (ref 40–150)
ALT: 17 U/L (ref 0–55)
AST: 15 U/L (ref 5–34)
Anion Gap: 9 mEq/L (ref 3–11)
BILIRUBIN TOTAL: 0.43 mg/dL (ref 0.20–1.20)
BUN: 14.2 mg/dL (ref 7.0–26.0)
CO2: 26 mEq/L (ref 22–29)
Calcium: 9.1 mg/dL (ref 8.4–10.4)
Chloride: 107 mEq/L (ref 98–109)
Creatinine: 0.8 mg/dL (ref 0.6–1.1)
EGFR: 81 mL/min/{1.73_m2} — ABNORMAL LOW (ref >90)
Glucose: 122 mg/dl (ref 70–140)
POTASSIUM: 4.4 meq/L (ref 3.5–5.1)
SODIUM: 142 meq/L (ref 136–145)
TOTAL PROTEIN: 7 g/dL (ref 6.4–8.3)

## 2014-10-15 LAB — CBC WITH DIFFERENTIAL/PLATELET
BASO%: 0.5 % (ref 0.0–2.0)
Basophils Absolute: 0 10*3/uL (ref 0.0–0.1)
EOS%: 4.9 % (ref 0.0–7.0)
Eosinophils Absolute: 0.2 10*3/uL (ref 0.0–0.5)
HCT: 37.6 % (ref 34.8–46.6)
HGB: 12.3 g/dL (ref 11.6–15.9)
LYMPH%: 27.6 % (ref 14.0–49.7)
MCH: 29.1 pg (ref 25.1–34.0)
MCHC: 32.7 g/dL (ref 31.5–36.0)
MCV: 89.1 fL (ref 79.5–101.0)
MONO#: 0.4 10*3/uL (ref 0.1–0.9)
MONO%: 9.8 % (ref 0.0–14.0)
NEUT#: 2.4 10*3/uL (ref 1.5–6.5)
NEUT%: 57.2 % (ref 38.4–76.8)
PLATELETS: 199 10*3/uL (ref 145–400)
RBC: 4.22 10*6/uL (ref 3.70–5.45)
RDW: 13.9 % (ref 11.2–14.5)
WBC: 4.3 10*3/uL (ref 3.9–10.3)
lymph#: 1.2 10*3/uL (ref 0.9–3.3)

## 2014-10-15 NOTE — Telephone Encounter (Signed)
disregard prev note-per pof to sch pt appt-gave pt copy of sch

## 2014-10-15 NOTE — Telephone Encounter (Signed)
reply from MM to sch w/ any APP-cld & spoke to pt and adv of appt time & date-pt understood °

## 2014-10-15 NOTE — Addendum Note (Signed)
Addended by: Marcelino Duster on: 10/15/2014 11:50 AM   Modules accepted: Orders

## 2014-10-15 NOTE — Progress Notes (Signed)
ID: Stephens Shire OB: Jul 24, 1961  MR#: [id number]  GYI#:[id number]  PCP: Geoffery Lyons, MD GYN:  Laurin Coder NP SU: Coralie Keens OTHER MD: Arloa Koh, Rupinder Angelina Ok  CHIEF COMPLAINT: Early stage breast cancer  CURRENT TREATMENT: Antiestrogen therapy   HISTORY OF PRESENT ILLNESS: From the original intake note:  "Jean [name]" had routine mammographic screening 10/02/2012 showing dense breasts (category C.). A possible distortion was noted in the right breast and a diagnostic right mammography and ultrasonography on 01/02/2013 confirmed a persistent area of distortion in the upper inner quadrant of the right breast measuring approximately 1 cm. This was not palpable. Ultrasound was not informative and showed no axillary adenopathy. 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  Bilateral breast MRIs 01/28/2013 showed a large hematoma at the biopsy site, measuring up to 6.1 cm, with minimal patchy enhancing nodularity at the periphery. There was a 1.7 cm lymph node in the right axillary region with a preserved fatty hilum but slightly thickened cortex. There were no other findings of concern.  The patient's subsequent history is as detailed below  INTERVAL HISTORY: Jean [name] returns today for followup of her breast cancer.she has been on anastrozole since October 2014 and is tolerating it relatively well. She has hot flashes but they are no worse. She uses water based lubrication for her vaginal dryness. She has been diagnosed with arthritis in her feet and is on celebrex. She gets married this upcoming June.   ROS: Jean [name] denies fevers, chills ,nausea, vomiting or changes in bowel or bladder habits. She has no shortness of breath, chest pain, cough, palpitations or fatigue. She was recently diagnosed with sleep  apnea and wears a CPAP. She is eating and drinking well. She denies headaches,diziness, vision changes, night sweats, or unexplained weight loss. She continues on xarelto for a history of DVT and denies bleeding. She has bilateral lower extremity edema and wears compression stockings as well. 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 A detailed review of systems is otherwise stable.  PAST MEDICAL HISTORY: Past Medical History  Diagnosis Date  . Vertigo     pt doesn't take any medication  . Pulmonary embolism 2012    takes Xarelto daily  . Obesity   . Breast cancer   . Hyperlipidemia     takes Lipitor daily  . PUD (peptic ulcer disease)     remote  . COPD with asthma   . Nasal congestion     takes Sudafed if needed)  . Depression     takes Viibryd daily as well as Abilify  . Pneumonia     hx of;last time around 1987  . Arthritis   . Joint pain   . GERD (gastroesophageal reflux disease)     related to Xarelto;takes Omeprazole daily  . Stress incontinence   . Nocturia   . Hx of radiation therapy 03/25/13- 05/06/13    right breast 4500 cGy 25 sessions, right breast boost 1000 cGy 5 sessions    PAST SURGICAL HISTORY: Past Surgical History  Procedure Laterality Date  . Adenoidectomy  1967  . Breast biopsy  May 2014    stage I beast cancer  . Breast lumpectomy with needle localization and axillary sentinel lymph node bx Right 02/07/2013    Procedure: BREAST LUMPECTOMY WITH NEEDLE LOCALIZATION AND  AXILLARY SENTINEL LYMPH NODE BX;  Surgeon: Harl Bowie, MD;  Location: [facility];  Service: General;  Laterality: Right;    FAMILY HISTORY Family History  Problem Relation Age of Onset  . Adopted: Yes  . Breast cancer Maternal Aunt 60  . Breast cancer Maternal Grandmother     post menopausal breast cancer  . Breast cancer Cousin 40    possible bilateral cancer  . Stomach cancer Neg Hx   . Pancreatic cancer Neg Hx   . Colon cancer Neg Hx    the patient is  adopted and has no information on her father's side of the family. Her biological mother is currently 61. Her biological mothers mother had a diagnosis of breast cancer but the patient does not know at what age. Also 1 maternal aunt was diagnosed with breast cancer as well as a first cousin, and a first cousin was diagnosed at age 86. The patient was tested for the BRCA mutation and is negative.  GYNECOLOGIC HISTORY:  Menarche age 73, perimenopause at around age 83. The patient then took hormone replacement for 5 years, quitting at age 56 when she developed a pulmonary embolus. She never took birth control pills. She is GX P0.  SOCIAL HISTORY:  Jean [name] works as a Administrator, "irregular afraid". In [place] she visits her adoptive mother Pamala Hurry and her adoptive brother Nicki Reaper. The patient's adoptive father is currently at [facility] following orthopedic surgery. There is a second adoptive brother. The patient herself is single, but married her partner in 2014 and moved in with her and her  4 children in [place]. She wishes to maintain her medical care here since her mother will not be moving and she will be coming to this area frequently.. The only pet at home is a Neurosurgeon    ADVANCED DIRECTIVES: In place. The patient has named her adoptive mother, Pamala Hurry, as her healthcare power of attorney. Barbara's cell number is [phone]   HEALTH MAINTENANCE: History  Substance Use Topics  . Smoking status: Never Smoker   . Smokeless tobacco: Never Used  . Alcohol Use: No     Colonoscopy:  PAP: 2014  Bone density:  Lipid panel:  Allergies  Allergen Reactions  . Codeine Shortness Of Breath    Current Outpatient Prescriptions  Medication Sig Dispense Refill  . anastrozole (ARIMIDEX) 1 MG tablet TAKE 1 TABLET BY MOUTH EVERY DAY 30 tablet 1  . ARIPiprazole (ABILIFY) 5 MG tablet Take 5 mg by mouth daily. Pt takes 1/2 tablet (2.5 mg) daily.    Marland Kitchen atorvastatin (LIPITOR) 20 MG tablet Take  20 mg by mouth daily.    . celecoxib (CELEBREX) 200 MG capsule Take 200 mg by mouth daily.     . cyclobenzaprine (FLEXERIL) 10 MG tablet Take 10 mg by mouth 3 (three) times daily as needed for muscle spasms.    . Vilazodone HCl (VIIBRYD) 40 MG TABS Take by mouth daily.    Alveda Reasons 20 MG TABS tablet Take 20 mg by mouth daily with supper.   2   No current facility-administered medications for this visit.    OBJECTIVE: Middle-aged white woman who appears stated age 54 Vitals:   10/15/14 1047  BP: 147/72  Pulse: 72  Temp: 98.2 F (36.8 C)  Resp: 18     Body mass index is 46.63 kg/(m^2).    ECOG FS: 1  Skin: warm, dry  HEENT: sclerae anicteric, conjunctivae pink, oropharynx clear. No thrush or mucositis.  Lymph  Nodes: No cervical or supraclavicular lymphadenopathy  Lungs: clear to auscultation bilaterally, no rales, wheezes, or rhonci  Heart: regular rate and rhythm  Abdomen: round, soft, non tender, positive bowel sounds  Musculoskeletal: No focal spinal tenderness, bilateral lower extremity edema +2, bilateral compression stockings worn Neuro: non focal, well oriented, positive affect  Breast: right breast status post lumpectomy and radiation. Scar tissue palpable along incision line. No evidence of recurrent disease. Right axilla benign. Left breast unremarkable.  LAB RESULTS:  CMP     Component Value Date/Time   NA 141 03/10/2014 0926   NA 140 02/06/2013 0828   K 4.1 03/10/2014 0926   K 3.8 02/06/2013 0828   CL 105 02/06/2013 0828   CL 105 01/30/2013 0823   CO2 25 03/10/2014 0926   CO2 26 02/06/2013 0828   GLUCOSE 190* 03/10/2014 0926   GLUCOSE 133* 02/06/2013 0828   GLUCOSE 104* 01/30/2013 0823   BUN 14.2 03/10/2014 0926   BUN 16 02/06/2013 0828   CREATININE 0.9 03/10/2014 0926   CREATININE 0.84 02/06/2013 0828   CALCIUM 9.4 03/10/2014 0926   CALCIUM 9.4 02/06/2013 0828   PROT 6.8 03/10/2014 0926   ALBUMIN 3.3* 03/10/2014 0926   AST 15 03/10/2014 0926   ALT  22 03/10/2014 0926   ALKPHOS 92 03/10/2014 0926   BILITOT 0.49 03/10/2014 0926   GFRNONAA 80* 02/06/2013 0828   GFRAA >90 02/06/2013 0828    I No results found for: SPEP  Lab Results  Component Value Date   WBC 4.3 10/15/2014   NEUTROABS 2.4 10/15/2014   HGB 12.3 10/15/2014   HCT 37.6 10/15/2014   MCV 89.1 10/15/2014   PLT 199 10/15/2014      Chemistry      Component Value Date/Time   NA 141 03/10/2014 0926   NA 140 02/06/2013 0828   K 4.1 03/10/2014 0926   K 3.8 02/06/2013 0828   CL 105 02/06/2013 0828   CL 105 01/30/2013 0823   CO2 25 03/10/2014 0926   CO2 26 02/06/2013 0828   BUN 14.2 03/10/2014 0926   BUN 16 02/06/2013 0828   CREATININE 0.9 03/10/2014 0926   CREATININE 0.84 02/06/2013 0828      Component Value Date/Time   CALCIUM 9.4 03/10/2014 0926   CALCIUM 9.4 02/06/2013 0828   ALKPHOS 92 03/10/2014 0926   AST 15 03/10/2014 0926   ALT 22 03/10/2014 0926   BILITOT 0.49 03/10/2014 0926       No results found for: LABCA2  No components found for: [id number]  No results for input(s): INR in the last 168 hours.  Urinalysis No results found for: COLORURINE  STUDIES: No results found. Most recent mammogram on 03/10/14 was unremarkable.  Most recent bone density scan on 07/09/13 showed a t-score of -0.2 (normal)  ASSESSMENT: 54 y.o. BRCA negative [place] woman status post right breast biopsy 01/22/2013 for a clinical T1b NX, stage IA invasive ductal carcinoma, grade 2, estrogen receptor 90% positive, progesterone receptor 90% positive, with no HER-2 amplification and an MIB-1 of 10%.  (1) Status post right lumpectomy and sentinel lymph node sampling 02/07/2013 for a pT1b pN0, stage IA invasive ductal breast cancer, grade 2, repeat HER-2 again negative  (2) Oncotype score of 11 predicts a distant recurrence rate within 10 years of 8% if the patient's only systemic treatment is tamoxifen for 5 years  (3) adjuvant radiation completed 04/30/2013  (4)  anastrozole started October 2014; normal bone density November 2014   PLAN:  Jean [name] is doing well as far as her breast cancer is concerned. The labs were reviewed in detail and were entirely stable. She is tolerating the anastrozole well and will continue with the goal of 5 years of antiestrogen therapy. She will continue with the celebrex for her arthritic pain.   Jean [name] will return for labs and a follow up visit in 6 months. Her next mammogram is due in July and her bone density scan will be due in November. She will likely move to yearly visits at this time. She understands and agrees with this plan. She knows the goal of treatment in her case is cure. She has been encouraged to call with any issues that might arise before her next visit here.   Marcelino Duster, NP   10/15/2014 10:59 AM

## 2014-10-20 ENCOUNTER — Other Ambulatory Visit: Payer: Self-pay | Admitting: Oncology

## 2014-10-20 DIAGNOSIS — C50211 Malignant neoplasm of upper-inner quadrant of right female breast: Secondary | ICD-10-CM

## 2015-04-01 ENCOUNTER — Other Ambulatory Visit (HOSPITAL_BASED_OUTPATIENT_CLINIC_OR_DEPARTMENT_OTHER): Payer: Commercial Indemnity

## 2015-04-01 ENCOUNTER — Other Ambulatory Visit: Payer: Self-pay | Admitting: Oncology

## 2015-04-01 ENCOUNTER — Ambulatory Visit (HOSPITAL_BASED_OUTPATIENT_CLINIC_OR_DEPARTMENT_OTHER): Payer: Commercial Indemnity | Admitting: Nurse Practitioner

## 2015-04-01 ENCOUNTER — Telehealth: Payer: Self-pay | Admitting: Oncology

## 2015-04-01 ENCOUNTER — Encounter: Payer: Self-pay | Admitting: Nurse Practitioner

## 2015-04-01 ENCOUNTER — Other Ambulatory Visit: Payer: Self-pay | Admitting: *Deleted

## 2015-04-01 VITALS — BP 129/71 | HR 77 | Temp 98.0°F | Resp 18 | Ht 64.0 in | Wt 281.3 lb

## 2015-04-01 DIAGNOSIS — Z79811 Long term (current) use of aromatase inhibitors: Secondary | ICD-10-CM | POA: Diagnosis not present

## 2015-04-01 DIAGNOSIS — Z853 Personal history of malignant neoplasm of breast: Secondary | ICD-10-CM

## 2015-04-01 DIAGNOSIS — E2839 Other primary ovarian failure: Secondary | ICD-10-CM

## 2015-04-01 DIAGNOSIS — C50211 Malignant neoplasm of upper-inner quadrant of right female breast: Secondary | ICD-10-CM | POA: Diagnosis not present

## 2015-04-01 DIAGNOSIS — Z170 Estrogen receptor positive status [ER+]: Secondary | ICD-10-CM | POA: Diagnosis not present

## 2015-04-01 LAB — COMPREHENSIVE METABOLIC PANEL (CC13)
ALT: 22 U/L (ref 0–55)
AST: 15 U/L (ref 5–34)
Albumin: 3.4 g/dL — ABNORMAL LOW (ref 3.5–5.0)
Alkaline Phosphatase: 101 U/L (ref 40–150)
Anion Gap: 6 mEq/L (ref 3–11)
BUN: 14.4 mg/dL (ref 7.0–26.0)
CALCIUM: 8.9 mg/dL (ref 8.4–10.4)
CO2: 29 mEq/L (ref 22–29)
Chloride: 107 mEq/L (ref 98–109)
Creatinine: 0.9 mg/dL (ref 0.6–1.1)
EGFR: 76 mL/min/{1.73_m2} — AB (ref >90)
Glucose: 140 mg/dl (ref 70–140)
Potassium: 4.1 mEq/L (ref 3.5–5.1)
Sodium: 142 mEq/L (ref 136–145)
Total Bilirubin: 0.5 mg/dL (ref 0.20–1.20)
Total Protein: 6.8 g/dL (ref 6.4–8.3)

## 2015-04-01 LAB — CBC WITH DIFFERENTIAL/PLATELET
BASO%: 0.5 % (ref 0.0–2.0)
Basophils Absolute: 0 10*3/uL (ref 0.0–0.1)
EOS%: 8.1 % — AB (ref 0.0–7.0)
Eosinophils Absolute: 0.4 10*3/uL (ref 0.0–0.5)
HEMATOCRIT: 37.2 % (ref 34.8–46.6)
HGB: 12.4 g/dL (ref 11.6–15.9)
LYMPH#: 1 10*3/uL (ref 0.9–3.3)
LYMPH%: 19.3 % (ref 14.0–49.7)
MCH: 29.5 pg (ref 25.1–34.0)
MCHC: 33.4 g/dL (ref 31.5–36.0)
MCV: 88.5 fL (ref 79.5–101.0)
MONO#: 0.4 10*3/uL (ref 0.1–0.9)
MONO%: 9.1 % (ref 0.0–14.0)
NEUT#: 3.1 10*3/uL (ref 1.5–6.5)
NEUT%: 63 % (ref 38.4–76.8)
PLATELETS: 198 10*3/uL (ref 145–400)
RBC: 4.2 10*6/uL (ref 3.70–5.45)
RDW: 14.2 % (ref 11.2–14.5)
WBC: 4.9 10*3/uL (ref 3.9–10.3)

## 2015-04-01 MED ORDER — ANASTROZOLE 1 MG PO TABS: 1.0000 mg | ORAL_TABLET | Freq: Every day | ORAL | Status: DC

## 2015-04-01 NOTE — Telephone Encounter (Signed)
One year appointment made and avs printed and mailed to patient

## 2015-04-01 NOTE — Progress Notes (Signed)
ID: Stephens Shire OB: 10-22-1960  MR#: [id number]  QRF#:[id number]  PCP: Geoffery Lyons, MD GYN:  Laurin Coder NP SU: Coralie Keens OTHER MD: Arloa Koh, Rupinder Angelina Ok  CHIEF COMPLAINT: Early stage breast cancer  CURRENT TREATMENT: Antiestrogen therapy  BREAST CANCER HISTORY: From the original intake note:  "Jean [name]" had routine mammographic screening 10/02/2012 showing dense breasts (category C.). A possible distortion was noted in the right breast and a diagnostic right mammography and ultrasonography on 01/02/2013 confirmed a persistent area of distortion in the upper inner quadrant of the right breast measuring approximately 1 cm. This was not palpable. Ultrasound was not informative and showed no axillary adenopathy. 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  Bilateral breast MRIs 01/28/2013 showed a large hematoma at the biopsy site, measuring up to 6.1 cm, with minimal patchy enhancing nodularity at the periphery. There was a 1.7 cm lymph node in the right axillary region with a preserved fatty hilum but slightly thickened cortex. There were no other findings of concern.  The patient's subsequent history is as detailed below  INTERVAL HISTORY: Jean [name] returns today for followup of her breast cancer.she has been on anastrozole since October 2014 and is tolerating it relatively well. She has hot flashes but they are no worse. She uses water based lubrication for her vaginal dryness. The interval history is remarkable for her wedding this June. She had a honeymoon at the beach for 1 week. She also had carpal tunnel surgery in July to her left hand. She is healing well from this.  ROS: Jean [name] denies fevers, chills ,nausea, vomiting or changes in bowel or bladder habits. She has put on some weight since  her last visit. She has no shortness of breath, chest pain, cough, palpitations or fatigue. She continues to wear her CPAP for sleep. 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 She continues on xarelto and denies bruising or bleeding. A detailed review of systems is otherwise stable.  PAST MEDICAL HISTORY: Past Medical History  Diagnosis Date  . Vertigo     pt doesn't take any medication  . Pulmonary embolism 2012    takes Xarelto daily  . Obesity   . Breast cancer   . Hyperlipidemia     takes Lipitor daily  . PUD (peptic ulcer disease)     remote  . COPD with asthma   . Nasal congestion     takes Sudafed if needed)  . Depression     takes Viibryd daily as well as Abilify  . Pneumonia     hx of;last time around 1987  . Arthritis   . Joint pain   . GERD (gastroesophageal reflux disease)     related to Xarelto;takes Omeprazole daily  . Stress incontinence   . Nocturia   . Hx of radiation therapy 03/25/13- 05/06/13    right breast 4500 cGy 25 sessions, right breast boost 1000 cGy 5 sessions    PAST SURGICAL HISTORY: Past Surgical History  Procedure Laterality Date  . Adenoidectomy  1967  . Breast biopsy  May 2014    stage I beast cancer  . Breast lumpectomy with needle localization and axillary sentinel lymph node bx Right 02/07/2013    Procedure: BREAST LUMPECTOMY WITH NEEDLE LOCALIZATION AND AXILLARY SENTINEL LYMPH NODE  BX;  Surgeon: Harl Bowie, MD;  Location: [facility];  Service: General;  Laterality: Right;    FAMILY HISTORY Family History  Problem Relation Age of Onset  . Adopted: Yes  . Breast cancer Maternal Aunt 60  . Breast cancer Maternal Grandmother     post menopausal breast cancer  . Breast cancer Cousin 40    possible bilateral cancer  . Stomach cancer Neg Hx   . Pancreatic cancer Neg Hx   . Colon cancer Neg Hx    the patient is adopted and has no information on her father's side  of the family. Her biological mother is currently 22. Her biological mothers mother had a diagnosis of breast cancer but the patient does not know at what age. Also 1 maternal aunt was diagnosed with breast cancer as well as a first cousin, and a first cousin was diagnosed at age 21. The patient was tested for the BRCA mutation and is negative.  GYNECOLOGIC HISTORY:  Menarche age 75, perimenopause at around age 89. The patient then took hormone replacement for 5 years, quitting at age 16 when she developed a pulmonary embolus. She never took birth control pills. She is GX P0.  SOCIAL HISTORY:  Jean [name] works as a Administrator, "irregular afraid". In [place] she visits her adoptive mother Pamala Hurry and her adoptive brother Nicki Reaper. The patient's adoptive father is currently at [facility] following orthopedic surgery. There is a second adoptive brother. The patient herself is single, but married her partner in 2014 and moved in with her and her  4 children in [place]. She wishes to maintain her medical care here since her mother will not be moving and she will be coming to this area frequently.. The only pet at home is a Neurosurgeon    ADVANCED DIRECTIVES: In place. The patient has named her adoptive mother, Pamala Hurry, as her healthcare power of attorney. Barbara's cell number is [phone]   HEALTH MAINTENANCE: History  Substance Use Topics  . Smoking status: Never Smoker   . Smokeless tobacco: Never Used  . Alcohol Use: No     Colonoscopy:  PAP: 2014  Bone density:  Lipid panel:  Allergies  Allergen Reactions  . Codeine Shortness Of Breath    Current Outpatient Prescriptions  Medication Sig Dispense Refill  . ARIPiprazole (ABILIFY) 5 MG tablet Take 5 mg by mouth daily. Pt takes 1/2 tablet (2.5 mg) daily.    Marland Kitchen atorvastatin (LIPITOR) 20 MG tablet Take 20 mg by mouth daily.    . celecoxib (CELEBREX) 200 MG capsule Take 200 mg by mouth daily.     . Vilazodone HCl (VIIBRYD) 40 MG TABS  Take by mouth daily.    Alveda Reasons 20 MG TABS tablet Take 20 mg by mouth daily with supper.   2  . anastrozole (ARIMIDEX) 1 MG tablet Take 1 tablet (1 mg total) by mouth daily. 30 tablet 5  . cyclobenzaprine (FLEXERIL) 10 MG tablet Take 10 mg by mouth 3 (three) times daily as needed for muscle spasms.     No current facility-administered medications for this visit.    OBJECTIVE: Middle-aged white woman who appears stated age 30 Vitals:   04/01/15 1057  BP: 129/71  Pulse: 77  Temp: 98 F (36.7 C)  Resp: 18     Body mass index is 48.26 kg/(m^2).    ECOG FS: 1  Skin: warm, dry  HEENT: sclerae anicteric, conjunctivae pink, oropharynx clear. No thrush or mucositis.  Lymph Nodes: No  cervical or supraclavicular lymphadenopathy  Lungs: clear to auscultation bilaterally, no rales, wheezes, or rhonci  Heart: regular rate and rhythm  Abdomen: round, soft, non tender, positive bowel sounds  Musculoskeletal: No focal spinal tenderness, chronic bilateral lower extremity edema Neuro: non focal, well oriented, positive affect  Breasts: right breast status post lumpectomy and radiation. No evidence of recurrent disease. Palpable scar tissue along incision line. Right axilla benign. Left breast unremarkable.  LAB RESULTS:  CMP     Component Value Date/Time   NA 142 04/01/2015 1038   NA 140 02/06/2013 0828   K 4.1 04/01/2015 1038   K 3.8 02/06/2013 0828   CL 105 02/06/2013 0828   CL 105 01/30/2013 0823   CO2 29 04/01/2015 1038   CO2 26 02/06/2013 0828   GLUCOSE 140 04/01/2015 1038   GLUCOSE 133* 02/06/2013 0828   GLUCOSE 104* 01/30/2013 0823   BUN 14.4 04/01/2015 1038   BUN 16 02/06/2013 0828   CREATININE 0.9 04/01/2015 1038   CREATININE 0.84 02/06/2013 0828   CALCIUM 8.9 04/01/2015 1038   CALCIUM 9.4 02/06/2013 0828   PROT 6.8 04/01/2015 1038   ALBUMIN 3.4* 04/01/2015 1038   AST 15 04/01/2015 1038   ALT 22 04/01/2015 1038   ALKPHOS 101 04/01/2015 1038   BILITOT 0.50 04/01/2015  1038   GFRNONAA 80* 02/06/2013 0828   GFRAA >90 02/06/2013 0828    I No results found for: SPEP  Lab Results  Component Value Date   WBC 4.9 04/01/2015   NEUTROABS 3.1 04/01/2015   HGB 12.4 04/01/2015   HCT 37.2 04/01/2015   MCV 88.5 04/01/2015   PLT 198 04/01/2015      Chemistry      Component Value Date/Time   NA 142 04/01/2015 1038   NA 140 02/06/2013 0828   K 4.1 04/01/2015 1038   K 3.8 02/06/2013 0828   CL 105 02/06/2013 0828   CL 105 01/30/2013 0823   CO2 29 04/01/2015 1038   CO2 26 02/06/2013 0828   BUN 14.4 04/01/2015 1038   BUN 16 02/06/2013 0828   CREATININE 0.9 04/01/2015 1038   CREATININE 0.84 02/06/2013 0828      Component Value Date/Time   CALCIUM 8.9 04/01/2015 1038   CALCIUM 9.4 02/06/2013 0828   ALKPHOS 101 04/01/2015 1038   AST 15 04/01/2015 1038   ALT 22 04/01/2015 1038   BILITOT 0.50 04/01/2015 1038       No results found for: LABCA2  No components found for: LABCA125  No results for input(s): INR in the last 168 hours.  Urinalysis No results found for: COLORURINE  STUDIES: No results found.  ASSESSMENT: 54 y.o. BRCA negative [place] woman status post right breast biopsy 01/22/2013 for a clinical T1b NX, stage IA invasive ductal carcinoma, grade 2, estrogen receptor 90% positive, progesterone receptor 90% positive, with no HER-2 amplification and an MIB-1 of 10%.  (1) Status post right lumpectomy and sentinel lymph node sampling 02/07/2013 for a pT1b pN0, stage IA invasive ductal breast cancer, grade 2, repeat HER-2 again negative  (2) Oncotype score of 11 predicts a distant recurrence rate within 10 years of 8% if the patient's only systemic treatment is tamoxifen for 5 years  (3) adjuvant radiation completed 04/30/2013  (4) anastrozole started October 2014; normal bone density November 2014   PLAN:  Jean [name] continues to do well as far as her breast cancer is concerned. She is now 2 years out from her definitive surgery with no  evidence of  recurrent disease. The labs were reviewed in detail and were entirely stable. She is tolerating the anastrozole well and will continue with the goal of 5 years of antiestrogen therapy.  Jean [name] will have a repeat mammogram next week. She will be due for a bone density scan this November.   Jean [name] will return for labs and a follow up visit in 1 year. She understands and agrees with this plan. She knows the goal of treatment in her case is cure. She has been encouraged to call with any issues that might arise before her next visit here.   Laurie Panda, NP   04/01/2015 1:07 PM

## 2015-04-01 NOTE — Telephone Encounter (Signed)
dexa scan appointment made for patient and she will get this appointment on mychart and will get it at her mammo appointment next week as well

## 2015-04-03 ENCOUNTER — Other Ambulatory Visit: Payer: Self-pay | Admitting: Oncology

## 2015-04-03 ENCOUNTER — Other Ambulatory Visit: Payer: Self-pay

## 2015-04-03 DIAGNOSIS — Z853 Personal history of malignant neoplasm of breast: Secondary | ICD-10-CM

## 2015-04-06 ENCOUNTER — Other Ambulatory Visit: Payer: Self-pay | Admitting: Hematology and Oncology

## 2015-04-06 ENCOUNTER — Telehealth: Payer: Self-pay | Admitting: *Deleted

## 2015-04-06 ENCOUNTER — Inpatient Hospital Stay: Admission: RE | Admit: 2015-04-06 | Payer: Commercial Indemnity | Source: Ambulatory Visit

## 2015-04-06 DIAGNOSIS — Z853 Personal history of malignant neoplasm of breast: Secondary | ICD-10-CM

## 2015-04-06 NOTE — Telephone Encounter (Signed)
"  Patient scheduled for annual mammogram at 10:00 today that needs co-sign.  Please call and let us know if order needs to be sent to another provider."  Jean [name] asking that order be sent to Dr. Lindi Adie.  Called collaborative nurse with this request.

## 2015-07-13 ENCOUNTER — Inpatient Hospital Stay: Admission: RE | Admit: 2015-07-13 | Payer: Commercial Indemnity | Source: Ambulatory Visit

## 2015-09-03 ENCOUNTER — Ambulatory Visit
Admission: RE | Admit: 2015-09-03 | Discharge: 2015-09-03 | Disposition: A | Discharge status: Home / Self Care | Payer: Managed Care, Other (non HMO) | Source: Ambulatory Visit | Attending: Internal Medicine | Admitting: Internal Medicine

## 2015-09-03 ENCOUNTER — Other Ambulatory Visit: Payer: Self-pay | Admitting: Internal Medicine

## 2015-09-03 DIAGNOSIS — R0602 Shortness of breath: Secondary | ICD-10-CM

## 2015-09-03 DIAGNOSIS — R05 Cough: Secondary | ICD-10-CM

## 2015-09-03 DIAGNOSIS — R059 Cough, unspecified: Secondary | ICD-10-CM

## 2015-09-03 MED ORDER — IOPAMIDOL (ISOVUE-370) INJECTION 76%
100.0000 mL | Freq: Once | INTRAVENOUS | Status: AC | PRN
  Administered 2015-09-03: 100 mL via INTRAVENOUS

## 2015-10-15 ENCOUNTER — Encounter: Payer: Self-pay | Admitting: Pulmonary Disease

## 2015-10-15 ENCOUNTER — Ambulatory Visit (INDEPENDENT_AMBULATORY_CARE_PROVIDER_SITE_OTHER): Payer: Managed Care, Other (non HMO) | Admitting: Pulmonary Disease

## 2015-10-15 VITALS — BP 122/80 | HR 73 | Temp 98.4°F | Ht 65.0 in | Wt 298.2 lb

## 2015-10-15 DIAGNOSIS — R0600 Dyspnea, unspecified: Secondary | ICD-10-CM | POA: Diagnosis not present

## 2015-10-15 DIAGNOSIS — E559 Vitamin D deficiency, unspecified: Secondary | ICD-10-CM | POA: Diagnosis not present

## 2015-10-15 DIAGNOSIS — J984 Other disorders of lung: Secondary | ICD-10-CM | POA: Diagnosis not present

## 2015-10-15 MED ORDER — CLONAZEPAM 0.5 MG PO TABS: 0.2500 mg | ORAL_TABLET | Freq: Two times a day (BID) | ORAL | Status: DC

## 2015-10-15 NOTE — Progress Notes (Signed)
Subjective:     Patient ID: Jean [name], female   DOB: 1961/01/31, 55 y.o.   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, Hx morbid obesity w/ BMI=50, ?of RADS after a "bout of something that lasted a month" & required Pred to resolve, Hx right breast cancer 2014, and Depression on Abilify & Viibryd from [employer];  She recalls that in Dec2016 she was bit by a cat, went to ER near [place] for eval & her O2sat on RA was ~91%, noted to be sl SOB (no wheezing, no cough, no sput, no CP) & CXR was reported neg;  She is a never smoker and notes DOE w/ stairs etc over the last few months, seems to be getting worse; when asked to expound on the sensation of SOB- she notes it's a sensation of not getting enough air "IN", not feeling satisfied breathing, can't get a deep breath;  She has gained some weight to her ~300# max now (5'5", BMI=49-50);  She also admits to a variety of stressors in her life, Father passed, 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   Smoking Hx>  She is a never smoker...  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   Medical Hx>  Hx right breat cancer 2014 w/ lumpectomy/ sentinel node by DrBlackman, XRT by DrMurray, Oncology f/u w/ DrMagrinat on Arimidex1mg ;  Hx IFG w/ BSs=120-190 recently in epic (not on meds);  Hx Hyperlipidemia on Atorva20;  Hx DJD on Celebrex & flexeril;  Hx depression followed by DrKaur on Abilify & Viibryd;  She blames the  Abilify for the wt gain- states she was ~180# 5-67yrs ago when this med was started & has gained up to 300# w/ BMI=49-50...   Family Hx>  Pt was adopted & biological FamHx is largely unknown but she found her birth mother some yrs ago & she has a strong FamHx of breast cancer...  Occup Hx>  She has had an interesting work history- former Airline pilot, Administrator, IT person;  She denies known toxic exposures, asbestos, etc...  Current Meds>  On Proair prn, Eugenio Hoes, Atorva20, Celebrex200, Flexeril10, Arimidex1, Abilify5, Viibryd40  EXAM shows Afeb, VSS, O2sat=95% on RA at rest;  5'5"Tall, wt=298#, BMI=49-50;  HEENT- neg, mallampati1;  Chest- clear w/o w/r/r, sl decr BS & excursion;  Heart- RR w/o m/r/g;  Abd- obese, soft, nontender;  Ext- VI, chr edema, stasis changes...  CXR in PACS 02/06/13 showed norm heart size, clear lungs w/ mild basilar atx, NAD;  Report from Pt- CXR 07/2015 in ER in [place] was neg...  CT Angio Chest 09/03/15 in PACS showed no evid for PE, heart 7 Ao were wnl, no pulm lesions/ nodules/ pneumonia/ effusions/ etc, post surg changes in right breast, fatty liver, DJD in spine...  Spirometry 10/15/15> FVC=2.66 (82%), FEV1=2.05 (78%), %1sec=77, mid-flows sl reduced at  725 predicted;  This is c/w mild restriction and min small airways dis...  Ambulatory Oximetry 10/15/15> O2sat=98% on RA at rest w/ pulse=72/min;  She ambulated 3 laps on RA w/ lowest O2sat=95% w/ HR=120/min...  Labs in Epic 08/2015> [place]- ok x BS=120-190, LFTs normal, cbc- ok w/ Hg=12.4, MCV=89, WBC=4-5K...  IMP >>    Dyspnea>  Appears to be multifactorial w/ components from pulmonary restriction & obesity, chest wall factors & "musc spasm", anxiety;  We discussed the need to diet/ exercise/ lose the weight, and trial of KLONOPIN 0.5mg  bid (but not while driving)...     Pulmonary restriction & obesity> she understands the necessity of weight reduction thru diet 7 exercise...    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    Hx RADS> on Proair prn per DrAronson; she denies any recent exacerbation, doing satis...       Medical Issues>          Hx right breast cancer-- followed by DrMagrinat on Arimidex; she is overdue for her mammogram & advised to get this ASAP...  Hyperlipidemia> on Atorva20, followed by DrAronson  IFG> her recent BSs were elev 120-190 & we reviewed low carb, low fat diet; she will f/u w/ PCP for A1c...  DJD> on Celebrex and Flexeril...  Depression> followed by DrKaur on Abilify [place] >>     Jean [name] does not appear to have any intrinsic lung dis- never smoker, clear CXR & neg CT Angio;  The Spirometry reveals min restriction likely due to her obesity & she understands that improvement in her dyspnea requires the hard work of diet/ exercise & weight reduction;  She has also been under a lot of stress from mult angles and she will work on this w/ Haematologist; in the meanwhile we discussed a trial of KLONOPIN 0.5mg - 1/2 to 1 Bid (but she will NOT take a dose when driving, rather take it when she completes her run & returns home); it is hoped that this intervention will allow her to get a better deeper breath & feel more satisfied breathing... We plan a brief recheck in about 6-8 weeks.    Past Medical History  Diagnosis Date  . Vertigo     pt doesn't take any medication  . Pulmonary embolism ([place]) 2012    takes Xarelto daily  . Obesity   . Breast cancer ([place])   . Hyperlipidemia     takes Lipitor daily  . PUD (peptic ulcer disease)     remote  . COPD with asthma ([place])   . Nasal congestion     takes Sudafed if needed)  . Depression     takes Viibryd daily as well as Abilify  . Pneumonia     hx of;last time around 1987  . Arthritis   . Joint pain   . GERD  (gastroesophageal reflux disease)     related to Xarelto;takes Omeprazole daily  . Stress incontinence   . Nocturia   . Hx of radiation therapy 03/25/13- 05/06/13    right breast 4500 cGy 25 sessions, right breast boost 1000 cGy 5 sessions    Past Surgical History  Procedure Laterality Date  . Adenoidectomy  1967  . Breast biopsy  May 2014  stage I beast cancer  . Breast lumpectomy with needle localization and axillary sentinel lymph node bx Right 02/07/2013    Procedure: BREAST LUMPECTOMY WITH NEEDLE LOCALIZATION AND AXILLARY SENTINEL LYMPH NODE BX;  Surgeon: Harl Bowie, MD;  Location: [facility];  Service: General;  Laterality: Right;    Outpatient Encounter Prescriptions as of 10/15/2015  Medication Sig  . ADVAIR DISKUS 250-50 MCG/DOSE AEPB Inhale 1 puff into the lungs.  Marland Kitchen anastrozole (ARIMIDEX) 1 MG tablet Take 1 tablet (1 mg total) by mouth daily.  . ARIPiprazole (ABILIFY) 5 MG tablet Take 2.5 mg by mouth daily. Pt takes 1/2 tablet (2.5 mg) daily.  Marland Kitchen atorvastatin (LIPITOR) 20 MG tablet Take 20 mg by mouth daily.  . celecoxib (CELEBREX) 200 MG capsule Take 200 mg by mouth daily.   . cholecalciferol (VITAMIN D) 1000 units tablet Take 1,000 Units by mouth daily.  . Vilazodone HCl (VIIBRYD) 40 MG TABS Take 40 mg by mouth daily.   Alveda Reasons 20 MG TABS tablet Take 20 mg by mouth daily with supper.   . clonazePAM (KLONOPIN) 0.5 MG tablet Take 0.5-1 tablets (0.25-0.5 mg total) by mouth 2 (two) times daily. As directed  . [DISCONTINUED] cyclobenzaprine (FLEXERIL) 10 MG tablet Take 10 mg by mouth 3 (three) times daily as needed for muscle spasms. Reported on 10/15/2015   No facility-administered encounter medications on file as of 10/15/2015.    Allergies  Allergen Reactions  . Codeine Shortness Of Breath  . Mobic [Meloxicam] Shortness Of Breath    Family History  Problem Relation Age of Onset  . Adopted: Yes  . Breast cancer Maternal Aunt 60  . Breast cancer Maternal Grandmother      post menopausal breast cancer  . Breast cancer Cousin 40    possible bilateral cancer  . Stomach cancer Neg Hx   . Pancreatic cancer Neg Hx   . Colon cancer Neg Hx     Social History   Social History  . Marital Status: Single    Spouse Name: N/A  . Number of Children: N/A  . Years of Education: N/A   Occupational History  . Not on file.   Social History Main Topics  . Smoking status: Never Smoker   . Smokeless tobacco: Never Used  . Alcohol Use: No  . Drug Use: No  . Sexual Activity: No     Comment: menarche age 25, perimenopause age 46, HRT x 5 yrs, G0 P0   Other Topics Concern  . Not on file   Social History Narrative    Current Medications, Allergies, Past Medical History, Past Surgical History, Family History, and Social History were reviewed in [employer] record.   Review of Systems             All symptoms NEG except where BOLDED >>  Constitutional:  F/C/S, fatigue, anorexia, unexpected weight change. HEENT:  HA, visual changes, hearing loss, earache, nasal symptoms, sore throat, mouth sores, hoarseness. Resp:  cough, sputum, hemoptysis; SOB, tightness, wheezing. Cardio:  CP, palpit, DOE, orthopnea, edema. GI:  N/V/D/C, blood in stool; reflux, abd pain, distention, gas. GU:  dysuria, freq, urgency, hematuria, flank pain, voiding difficulty. MS:  joint pain, swelling, tenderness, decr ROM; neck pain, back pain, etc. Neuro:  HA, tremors, seizures, dizziness, syncope, weakness, numbness, gait abn. Skin:  suspicious lesions or skin rash. Heme:  adenopathy, bruising, bleeding. Psyche:  confusion, agitation, sleep disturbance, hallucinations, anxiety, depression suicidal.   Objective:   Physical Exam  Vital Signs:  Reviewed...  General:  WD, Obese, 55 y/o WF in NAD; alert & oriented; pleasant & cooperative... HEENT:  [place]/AT; Conjunctiva- pink, Sclera- nonicteric, EOM-wnl, PERRLA, Fundi-benign; EACs-clear, TMs-wnl; NOSE-clear;  THROAT-clear & wnl. Neck:  Supple w/ fair ROM; no JVD; normal carotid impulses w/o bruits; no thyromegaly or nodules palpated; no lymphadenopathy. Chest:  Clear to P & A; without wheezes, rales, or rhonchi heard, some decr air movement & excursions Heart:  Regular Rhythm; norm S1 & S2 without murmurs, rubs, or gallops detected. Abdomen:  Obese, soft & nontender- no guarding or rebound; normal bowel sounds; no organomegaly or masses palpated. Ext:  decrROM; +arthritic changes; +varicose veins, +venous insuffic, chr edema & stasis changes... Neuro:  CNs II-XII intact; motor testing normal; neuropathy & gait abn... Derm:  No lesions noted; no rash etc. Lymph:  No cervical, supraclavicular, axillary, or inguinal adenopathy palpated.   Assessment:      IMP >>    Dyspnea>  Appears to be multifactorial w/ components from pulmonary restriction & obesity, chest wall factors & "musc spasm", anxiety;  We discussed the need to diet/ exercise/ lose the weight, and trial of KLONOPIN 0.5mg  bid (but not while driving)...     Pulmonary restriction & obesity> she understands the necessity of weight reduction thru diet 7 exercise...    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    Hx RADS> on Proair prn per DrAronson; she denies any recent exacerbation, doing satis...       Medical Issues>          Hx right breast cancer-- followed by DrMagrinat on Arimidex; she is overdue for her mammogram & advised to get this ASAP...  Hyperlipidemia> on Atorva20, followed by DrAronson  IFG> her recent BSs were elev 120-190 & we reviewed low carb, low fat diet; she will f/u w/ PCP for A1c...  DJD> on Celebrex and Flexeril...  Depression> followed by DrKaur on Abilify [place] >>     Stefane does not  appear to have any intrinsic lung dis- never smoker, clear CXR & neg CT Angio;  The Spirometry reveals min restriction likely due to her obesity & she understands that improvement in her dyspnea requires the hard work of diet/ exercise & weight reduction;  She has also been under a lot of stress from mult angles and she will work on this w/ Haematologist; in the meanwhile we discussed a trial of KLONOPIN 0.5mg - 1/2 to 1 Bid (but she will NOT take a dose when driving, rather take it when she completes her run & returns home; it is hoped that this intervention will allow her to get a better deeper breath 7 feel more satisfied breathing... We plan a brief recheck in about 6-8 weeks.     Plan:     Patient's Medications  New Prescriptions   CLONAZEPAM (KLONOPIN) 0.5 MG TABLET    Take 0.5-1 tablets (0.25-0.5 mg total) by mouth 2 (two) times daily. As directed  Previous Medications   ADVAIR DISKUS 250-50 MCG/DOSE AEPB    Inhale 1 puff into the lungs.   ANASTROZOLE (ARIMIDEX) 1 MG TABLET    Take 1 tablet (1 mg total) by mouth daily.   ARIPIPRAZOLE (ABILIFY) 5 MG  TABLET    Take 2.5 mg by mouth daily. Pt takes 1/2 tablet (2.5 mg) daily.   ATORVASTATIN (LIPITOR) 20 MG TABLET    Take 20 mg by mouth daily.   CELECOXIB (CELEBREX) 200 MG CAPSULE    Take 200 mg by mouth daily.    CHOLECALCIFEROL (VITAMIN D) 1000 UNITS TABLET    Take 1,000 Units by mouth daily.   VILAZODONE HCL (VIIBRYD) 40 MG TABS    Take 40 mg by mouth daily.    XARELTO 20 MG TABS TABLET    Take 20 mg by mouth daily with supper.   Modified Medications   No medications on file  Discontinued Medications   CYCLOBENZAPRINE (FLEXERIL) 10 MG TABLET    Take 10 mg by mouth 3 (three) times daily as needed for muscle spasms. Reported on 10/15/2015

## 2015-10-15 NOTE — Patient Instructions (Signed)
Jean [name]-- it was great meeting you today...  We reviewed your recent medical data and we performed a spirometry breathing test and an ambulatory oximetry test...  We reviewed the restrictive pattern on your breathing test and discussed:    1)  Diet, exwercise, weight reduction strategies.Marland KitchenMarland Kitchen    2)  Trial of KLONOPIN 0.5mg  - 1/2 to 1 tab twice daily for the "chest wall muscle spasm" component  Call for any questions...  Let's plan a follow up visit in 6-8 weeks, sooner if needed for problems.Marland KitchenMarland Kitchen

## 2015-11-05 ENCOUNTER — Other Ambulatory Visit: Payer: Self-pay | Admitting: Nurse Practitioner

## 2015-11-05 DIAGNOSIS — C50211 Malignant neoplasm of upper-inner quadrant of right female breast: Secondary | ICD-10-CM

## 2015-11-09 ENCOUNTER — Other Ambulatory Visit: Payer: Self-pay

## 2015-12-03 ENCOUNTER — Ambulatory Visit
Admission: RE | Admit: 2015-12-03 | Discharge: 2015-12-03 | Disposition: A | Discharge status: Home / Self Care | Payer: Managed Care, Other (non HMO) | Source: Ambulatory Visit | Attending: Nurse Practitioner | Admitting: Nurse Practitioner

## 2015-12-03 ENCOUNTER — Ambulatory Visit
Admission: RE | Admit: 2015-12-03 | Discharge: 2015-12-03 | Disposition: A | Discharge status: Home / Self Care | Payer: Managed Care, Other (non HMO) | Source: Ambulatory Visit | Attending: Hematology and Oncology | Admitting: Hematology and Oncology

## 2015-12-03 DIAGNOSIS — Z853 Personal history of malignant neoplasm of breast: Secondary | ICD-10-CM

## 2015-12-03 DIAGNOSIS — E2839 Other primary ovarian failure: Secondary | ICD-10-CM

## 2015-12-09 ENCOUNTER — Encounter: Payer: Managed Care, Other (non HMO) | Admitting: Pulmonary Disease

## 2015-12-09 DIAGNOSIS — J984 Other disorders of lung: Secondary | ICD-10-CM

## 2015-12-09 DIAGNOSIS — R0600 Dyspnea, unspecified: Secondary | ICD-10-CM

## 2015-12-09 NOTE — Patient Instructions (Signed)
Today we updated your med list in our EPIC system...    Continue your current medications the same...  Call for any questions or if we can be of service in any way.Marland KitchenMarland Kitchen

## 2016-03-22 ENCOUNTER — Telehealth: Payer: Self-pay | Admitting: Oncology

## 2016-03-22 NOTE — Telephone Encounter (Signed)
Called patient to confirm appointment. Lef message. Appointment letter and schedule mailed. Merleen Nicely.

## 2016-03-31 ENCOUNTER — Ambulatory Visit: Payer: Commercial Indemnity | Admitting: Oncology

## 2016-03-31 ENCOUNTER — Other Ambulatory Visit: Payer: Commercial Indemnity

## 2016-04-12 ENCOUNTER — Ambulatory Visit: Payer: Self-pay | Admitting: Oncology

## 2016-04-12 ENCOUNTER — Other Ambulatory Visit: Payer: Self-pay

## 2016-06-03 ENCOUNTER — Other Ambulatory Visit: Payer: Self-pay | Admitting: *Deleted

## 2016-06-03 DIAGNOSIS — C50211 Malignant neoplasm of upper-inner quadrant of right female breast: Secondary | ICD-10-CM

## 2016-06-06 ENCOUNTER — Ambulatory Visit (HOSPITAL_BASED_OUTPATIENT_CLINIC_OR_DEPARTMENT_OTHER): Payer: Managed Care, Other (non HMO) | Admitting: Oncology

## 2016-06-06 ENCOUNTER — Other Ambulatory Visit (HOSPITAL_BASED_OUTPATIENT_CLINIC_OR_DEPARTMENT_OTHER): Payer: Managed Care, Other (non HMO)

## 2016-06-06 VITALS — BP 164/86 | HR 83 | Temp 98.0°F | Resp 18 | Ht 65.0 in | Wt 300.1 lb

## 2016-06-06 DIAGNOSIS — Z79811 Long term (current) use of aromatase inhibitors: Secondary | ICD-10-CM

## 2016-06-06 DIAGNOSIS — Z170 Estrogen receptor positive status [ER+]: Secondary | ICD-10-CM | POA: Diagnosis not present

## 2016-06-06 DIAGNOSIS — C50211 Malignant neoplasm of upper-inner quadrant of right female breast: Secondary | ICD-10-CM | POA: Diagnosis not present

## 2016-06-06 LAB — CBC WITH DIFFERENTIAL/PLATELET
BASO%: 0.2 % (ref 0.0–2.0)
Basophils Absolute: 0 10*3/uL (ref 0.0–0.1)
EOS%: 6.4 % (ref 0.0–7.0)
Eosinophils Absolute: 0.3 10*3/uL (ref 0.0–0.5)
HCT: 35.9 % (ref 34.8–46.6)
HEMOGLOBIN: 12.1 g/dL (ref 11.6–15.9)
LYMPH#: 1.2 10*3/uL (ref 0.9–3.3)
LYMPH%: 27.1 % (ref 14.0–49.7)
MCH: 29.2 pg (ref 25.1–34.0)
MCHC: 33.7 g/dL (ref 31.5–36.0)
MCV: 86.7 fL (ref 79.5–101.0)
MONO#: 0.4 10*3/uL (ref 0.1–0.9)
MONO%: 9.4 % (ref 0.0–14.0)
NEUT%: 56.9 % (ref 38.4–76.8)
NEUTROS ABS: 2.4 10*3/uL (ref 1.5–6.5)
PLATELETS: 164 10*3/uL (ref 145–400)
RBC: 4.14 10*6/uL (ref 3.70–5.45)
RDW: 13.9 % (ref 11.2–14.5)
WBC: 4.3 10*3/uL (ref 3.9–10.3)

## 2016-06-06 LAB — COMPREHENSIVE METABOLIC PANEL
ALT: 17 U/L (ref 0–55)
AST: 13 U/L (ref 5–34)
Albumin: 3.4 g/dL — ABNORMAL LOW (ref 3.5–5.0)
Alkaline Phosphatase: 123 U/L (ref 40–150)
Anion Gap: 9 mEq/L (ref 3–11)
BILIRUBIN TOTAL: 0.4 mg/dL (ref 0.20–1.20)
BUN: 13.9 mg/dL (ref 7.0–26.0)
CALCIUM: 9.1 mg/dL (ref 8.4–10.4)
CHLORIDE: 105 meq/L (ref 98–109)
CO2: 25 mEq/L (ref 22–29)
CREATININE: 0.9 mg/dL (ref 0.6–1.1)
EGFR: 76 mL/min/{1.73_m2} — ABNORMAL LOW (ref >90)
Glucose: 192 mg/dl — ABNORMAL HIGH (ref 70–140)
Potassium: 4.2 mEq/L (ref 3.5–5.1)
Sodium: 140 mEq/L (ref 136–145)
TOTAL PROTEIN: 7.4 g/dL (ref 6.4–8.3)

## 2016-06-06 MED ORDER — ANASTROZOLE 1 MG PO TABS: ORAL_TABLET | ORAL | 11 refills | Status: DC

## 2016-06-06 NOTE — Progress Notes (Signed)
ID: Adolphus Birchwood OB: Apr 05, 1961  MR#: [id number]  CSN#:[id number]  PCP: Geoffery Lyons, MD GYN:  Laurin Coder NP SU: Coralie Keens OTHER MD: Arloa Koh, Rupinder Angelina Ok  CHIEF COMPLAINT: Early stage breast cancer  CURRENT TREATMENT: Antiestrogen therapy  BREAST CANCER HISTORY: From the original intake note:  "Jean [name]" had routine mammographic screening 10/02/2012 showing dense breasts (category C.). A possible distortion was noted in the right breast and a diagnostic right mammography and ultrasonography on 01/02/2013 confirmed a persistent area of distortion in the upper inner quadrant of the right breast measuring approximately 1 cm. This was not palpable. Ultrasound was not informative and showed no axillary adenopathy. 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  Bilateral breast MRIs 01/28/2013 showed a large hematoma at the biopsy site, measuring up to 6.1 cm, with minimal patchy enhancing nodularity at the periphery. There was a 1.7 cm lymph node in the right axillary region with a preserved fatty hilum but slightly thickened cortex. There were no other findings of concern.  The patient's subsequent history is as detailed below  INTERVAL HISTORY: Jean [name] returns today for followup of her estrogen receptor positive breast cancer. she continues on anastrozole, with good tolerance. Hot flashes and vaginal dryness are minor and workable. She never developed the arthralgias or myalgias that many patients can experience on this medication. She obtains it at a good price.  ROS: Jean [name] tells me she is prediabetic and is followed by Dr. Joya Salm for this. Her current A1c is 6.2. She has not been able to fully count role this with diet and is planning to join [employer]. 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 She is hoping to get a job in [place] of course which would be much more convenient. She reports no bleeding or significant bruising problems despite the rivaroxaban. Aside from these issues, a detailed review of systems today was stable  PAST MEDICAL HISTORY: Past Medical History:  Diagnosis Date  . Arthritis   . Breast cancer ([place])   . COPD with asthma ([place])   . Depression    takes Viibryd daily as well as Abilify  . GERD (gastroesophageal reflux disease)    related to Xarelto;takes Omeprazole daily  . Hx of radiation therapy 03/25/13- 05/06/13   right breast 4500 cGy 25 sessions, right breast boost 1000 cGy 5 sessions  . Hyperlipidemia    takes Lipitor daily  . Joint pain   . Nasal congestion    takes Sudafed if needed)  . Nocturia   . Obesity   . Pneumonia    hx of;last time around 1987  . PUD (peptic ulcer disease)    remote  . Pulmonary embolism ([place]) 2012   takes Xarelto daily  . Stress incontinence   . Vertigo    pt doesn't take any medication    PAST SURGICAL HISTORY: Past Surgical History:  Procedure Laterality Date  . ADENOIDECTOMY  1967  . BREAST BIOPSY  May 2014   stage I beast cancer  . BREAST LUMPECTOMY WITH NEEDLE LOCALIZATION AND AXILLARY SENTINEL LYMPH NODE BX Right 02/07/2013   Procedure: BREAST LUMPECTOMY WITH NEEDLE LOCALIZATION AND AXILLARY SENTINEL LYMPH NODE  BX;  Surgeon: Harl Bowie, MD;  Location: [facility];  Service: General;  Laterality: Right;    FAMILY HISTORY Family History  Problem Relation Age of Onset  . Adopted: Yes  . Breast cancer Maternal Aunt 60  . Breast cancer Maternal Grandmother     post menopausal breast cancer  . Breast cancer Cousin 40    possible bilateral cancer  . Stomach cancer Neg Hx   . Pancreatic cancer Neg Hx   . Colon cancer Neg Hx    the patient is adopted and has  no information on her father's side of the family. Her biological mother is currently 42. Her biological mothers mother had a diagnosis of breast cancer but the patient does not know at what age. Also 1 maternal aunt was diagnosed with breast cancer as well as a first cousin, and a first cousin was diagnosed at age 41. The patient was tested for the BRCA mutation and is negative.  GYNECOLOGIC HISTORY:  Menarche age 65, perimenopause at around age 4. The patient then took hormone replacement for 5 years, quitting at age 66 when she developed a pulmonary embolus. She never took birth control pills. She is GX P0.  SOCIAL HISTORY:  Jean [name] works as a Administrator, "irregular afraid". In [place] she visits her adoptive mother Jean [name] and her adoptive brother Nicki Reaper. The patient's adoptive father is currently at [facility] following orthopedic surgery. There is a second adoptive brother. The patient herself is single, but married her partner in 2014 and moved in with her and her  4 children in [place]. She wishes to maintain her medical care here since her mother will not be moving and she will be coming to this area frequently.. The only pet at home is a Neurosurgeon    ADVANCED DIRECTIVES: In place. The patient has named her adoptive mother, Jean [name], as her healthcare power of attorney. Barbara's cell number is [phone]   HEALTH MAINTENANCE: Social History  Substance Use Topics  . Smoking status: Never Smoker  . Smokeless tobacco: Never Used  . Alcohol use No     Colonoscopy:  PAP: 2014  Bone density:  Lipid panel:  Allergies  Allergen Reactions  . Codeine Shortness Of Breath  . Mobic [Meloxicam] Shortness Of Breath    Current Outpatient Prescriptions  Medication Sig Dispense Refill  . ADVAIR DISKUS 250-50 MCG/DOSE AEPB Inhale 1 puff into the lungs. Reported on 12/09/2015  11  . anastrozole (ARIMIDEX) 1 MG tablet TAKE 1 TABLET(1 MG) BY MOUTH DAILY 30 tablet 11  . ARIPiprazole  (ABILIFY) 5 MG tablet Take 2.5 mg by mouth daily. Pt takes 1/2 tablet (2.5 mg) daily.    Marland Kitchen atorvastatin (LIPITOR) 20 MG tablet Take 20 mg by mouth daily.    . celecoxib (CELEBREX) 200 MG capsule Take 200 mg by mouth daily.     . cholecalciferol (VITAMIN D) 1000 units tablet Take 1,000 Units by mouth daily.    . clonazePAM (KLONOPIN) 0.5 MG tablet Take 0.5-1 tablets (0.25-0.5 mg total) by mouth 2 (two) times daily. As directed 60 tablet 5  . Vilazodone HCl (VIIBRYD) 40 MG TABS Take 40 mg by mouth daily.     Alveda Reasons 20 MG TABS tablet Take 20 mg by mouth daily with supper.   2   No current facility-administered medications for this visit.     OBJECTIVE: Middle-aged white woman In no acute distress  Vitals:   06/06/16 1615  BP: (!) 164/86  Pulse: 83  Resp: 18  Temp: 98 F (36.7 C)     Body mass index is 49.94 kg/m.    ECOG FS: 1  Sclerae unicteric, pupils round and equal Oropharynx clear and moist-- no thrush or other lesions No cervical or supraclavicular adenopathy Lungs no rales or rhonchi Heart regular rate and rhythm Abd soft, obese, nontender, positive bowel sounds MSK no focal spinal tenderness, no upper extremity lymphedema Neuro: nonfocal, well oriented, appropriate affect Breasts: The right breast is status post lumpectomy and radiation. There is palpable scar along the incision line, unchanged from prior. There is no evidence of local recurrence. The right axilla is benign. The left breast is unremarkable.    LAB RESULTS:  CMP     Component Value Date/Time   NA 140 06/06/2016 1509   K 4.2 06/06/2016 1509   CL 105 02/06/2013 0828   CL 105 01/30/2013 0823   CO2 25 06/06/2016 1509   GLUCOSE 192 (H) 06/06/2016 1509   GLUCOSE 104 (H) 01/30/2013 0823   BUN 13.9 06/06/2016 1509   CREATININE 0.9 06/06/2016 1509   CALCIUM 9.1 06/06/2016 1509   PROT 7.4 06/06/2016 1509   ALBUMIN 3.4 (L) 06/06/2016 1509   AST 13 06/06/2016 1509   ALT 17 06/06/2016 1509   ALKPHOS  123 06/06/2016 1509   BILITOT 0.40 06/06/2016 1509   GFRNONAA 80 (L) 02/06/2013 0828   GFRAA >90 02/06/2013 0828    I No results found for: SPEP  Lab Results  Component Value Date   WBC 4.3 06/06/2016   NEUTROABS 2.4 06/06/2016   HGB 12.1 06/06/2016   HCT 35.9 06/06/2016   MCV 86.7 06/06/2016   PLT 164 06/06/2016      Chemistry      Component Value Date/Time   NA 140 06/06/2016 1509   K 4.2 06/06/2016 1509   CL 105 02/06/2013 0828   CL 105 01/30/2013 0823   CO2 25 06/06/2016 1509   BUN 13.9 06/06/2016 1509   CREATININE 0.9 06/06/2016 1509      Component Value Date/Time   CALCIUM 9.1 06/06/2016 1509   ALKPHOS 123 06/06/2016 1509   AST 13 06/06/2016 1509   ALT 17 06/06/2016 1509   BILITOT 0.40 06/06/2016 1509       No results found for: LABCA2  No components found for: LABCA125  No results for input(s): INR in the last 168 hours.  Urinalysis No results found for: COLORURINE  STUDIES: PATIENT: Name: Takerra, Lupinacci Patient ID: [id number] Birth Date: 09-07-1960 Height: 64.0 in. Sex: Female Measured: 12/03/2015 Weight: 279.0 lbs.  Indications: Anastrazole, Breast Cancer History, Caucasian, Estrogen Deficient, Low Calcium Intake (269.3), Postmenopausal, Vitamin D Deficient Fractures: Treatments: Vitamin D (E933.5)  ASSESSMENT:  The BMD measured at Femur Neck Left is 0.904 g/cm2 with a T-score of -1.0. This patient is considered normal according to [facility]) criteria. There has been a statistically significant increase in BMD of lumbar spine since prior exam dated 07/09/2013. There has been a statistically significant decrease in BMD of left hip since prior exam dated 07/09/2013.   CLINICAL DATA:  Annual diagnostic mammography. Patient underwent right lumpectomy for breast carcinoma in June 2014. No current complaints.  EXAM: 2D DIGITAL DIAGNOSTIC BILATERAL MAMMOGRAM WITH CAD AND ADJUNCT TOMO  COMPARISON:  Previous  exam(s).  ACR Breast Density Category c: The breast tissue is heterogeneously dense, which may obscure small masses.  FINDINGS: Focal opacity and architectural distortion with associated dystrophic calcifications and surgical vascular clips in the medial  right breast reflects the lumpectomy site.  There are no discrete masses or other areas of architectural distortion. There are no suspicious calcifications.  Mammographic images were processed with CAD.  IMPRESSION: No evidence of recurrent or new breast malignancy. Benign postsurgical changes on the right.  RECOMMENDATION: Diagnostic mammography in 1 year per standard post lumpectomy protocol.  I have discussed the findings and recommendations with the patient. Results were also provided in writing at the conclusion of the visit. If applicable, a reminder letter will be sent to the patient regarding the next appointment.  BI-RADS CATEGORY  2: Benign.   Electronically Signed   By: Lajean Manes M.D.   On: 12/03/2015 11:00   ASSESSMENT: 55 y.o. BRCA negative [place] woman status post right breast biopsy 01/22/2013 for a clinical T1b NX, stage IA invasive ductal carcinoma, grade 2, estrogen receptor 90% positive, progesterone receptor 90% positive, with no HER-2 amplification and an MIB-1 of 10%.  (1) Status post right lumpectomy and sentinel lymph node sampling 02/07/2013 for a pT1b pN0, stage IA invasive ductal breast cancer, grade 2, repeat HER-2 again negative  (2) Oncotype score of 11 predicts a distant recurrence rate within 10 years of 8% if the patient's only systemic treatment is tamoxifen for 5 years  (3) adjuvant radiation completed 04/30/2013  (4) anastrozole started October 2014; normal bone density November 2014  (a) normal bone density 12/03/2015 with a T score of -1.0   PLAN:  Jean [name] is is now more than 3 years out from definitive surgery for her breast cancer with no evidence of disease  recurrence. This is very favorable.  She tolerates anastrozole well, and today we discussed some options for the vaginal dryness problem, which will not Jean [name] can be a nuisance. The plan is to continue anastrozole for total of 5 years, at which point she will "graduate".  She knows to call for any problems that may develop before her next visit here, which will be in one year Chauncey Cruel, MD   06/06/2016 4:33 PM

## 2016-07-08 ENCOUNTER — Encounter: Payer: Self-pay | Admitting: *Deleted

## 2016-07-08 ENCOUNTER — Other Ambulatory Visit: Payer: Self-pay | Admitting: *Deleted

## 2016-07-08 NOTE — Patient Outreach (Signed)
[facility]) Care Management  07/08/2016  Jean [name] Dec 29, 1960 [id number]   Subjective: Telephone call to patient's home / mobile number, spoke with patient, and HIPAA verified.   Discussed [facility] Care Management [employer] Transition of care follow up.  Patient voices understanding and is in agreement to complete follow up. Patient states she is doing much, still very tired, has little energy, and is aware that recovery will take awhile since she had an infection.    States she is receiving outpatient lymphedema therapy twice a week, which has really helped a great deal with the swelling and was a source of the problem.  States she is taking bactrim (antibodic) as prescribed.  States she has a history of a massive pulmonary embolus.  Patient states she had follow up with visit primary MD (Dr. Reynaldo Minium) on 07/06/16 in [place] and will continue follow up with providers in [place].   States she has been living in [place] for the past 4 years with her spouse and children.   Patient states she has a supportive family, Counselling psychologist, great primary care physician, and has assistance as needed.  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  Patient states she does not have any transition of care, care coordination, disease management, disease monitoring, transportation, community resource, or pharmacy needs at this time.   States she is very appreciative of the follow up call and her goal is to remain out of the hospital.   Patient in agreement to receive [facility] Management information.   Objective: Per chart review:  Patient has a history of prediabetes, GERD, depression, breast cancer, and COPD.  Per Sunny Schlein, patient hospitalized  07/01/16 -07/05/16 for cellulitis of the trunk at [facility] in  [place], [place].   Assessment: Received [employer] Transition of care referral via [employer] on 07/08/16.    Transition  of care follow up completed, no care management needs, and will proceed with case closure.  Plan: RNCM will send patient successful outreach letter, pamphlet, and magnet. RNCM will send case closure due to follow up completed / no care management needs request to [facility].  [name] H. Annia Friendly, BSN, [facility] Management [facility] Telephonic CM Phone: [phone] Fax: [phone]

## 2016-09-19 IMAGING — CT CT ANGIO CHEST
1 of 3 series · 18 of 31 positions shown · IV contrast (APPLIED)
Comparison: None.

CLINICAL DATA: Shortness of breath for 1.5 weeks with decreased O2
saturation. Patient has history of pulmonary embolus in 4344.

EXAM:
CT ANGIOGRAPHY CHEST WITH CONTRAST
TECHNIQUE: Multidetector CT imaging of the chest was performed using the
standard protocol during bolus administration of intravenous
contrast. Multiplanar CT image reconstructions and MIPs were
obtained to evaluate the vascular anatomy.
CONTRAST:  100 mL Isovue 370

[Series 10: thins 1.5 b31s · axial · 0.88mm/px · z∈[-329,-84]mm · 18 of 185 slices shown]
[im 11/185  lung]
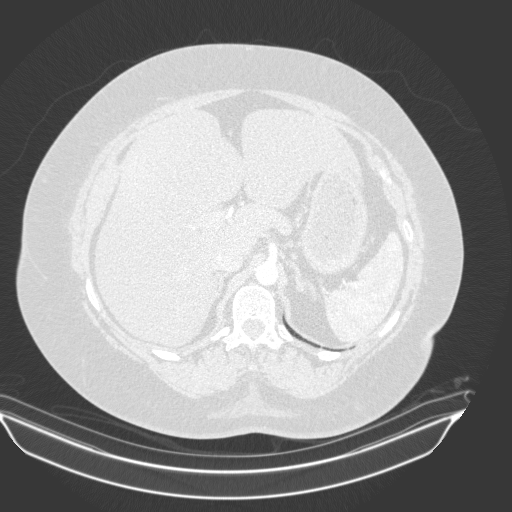
[im 22/185  mediastinal]
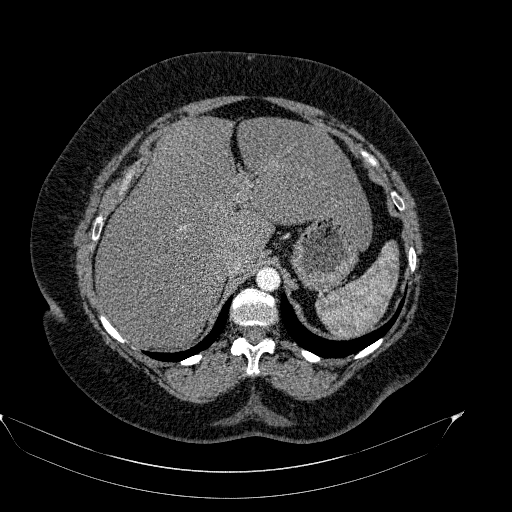
[im 33/185  lung]
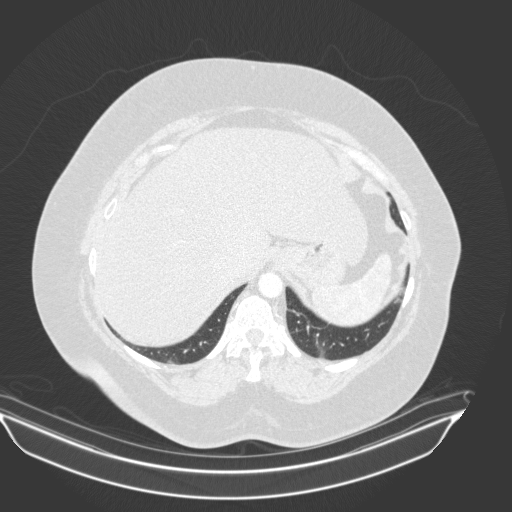
[im 44/185  mediastinal]
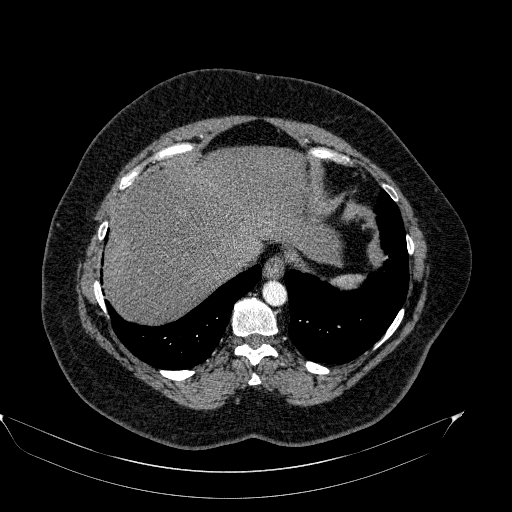
[im 55/185  lung]
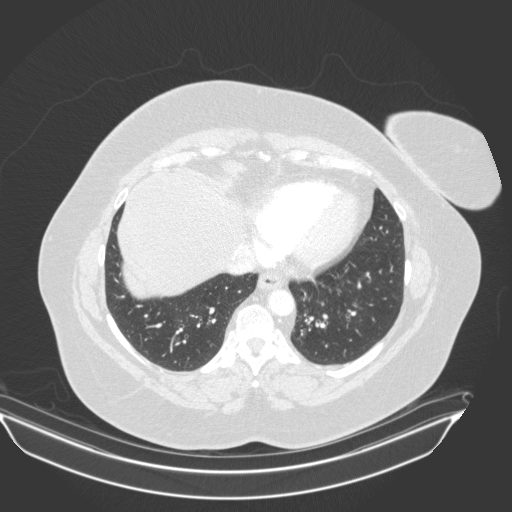
[im 62/185  mediastinal]
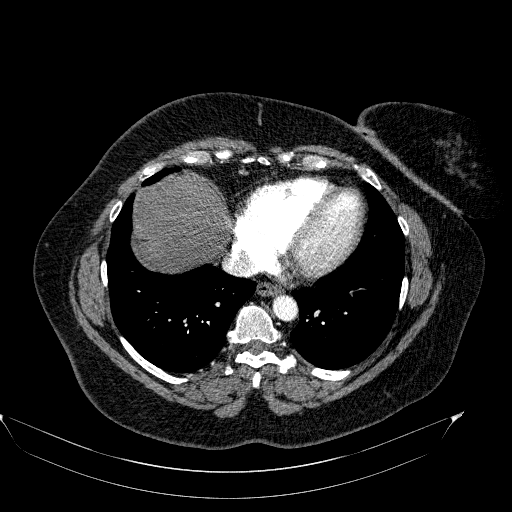
[im 65/185  lung]
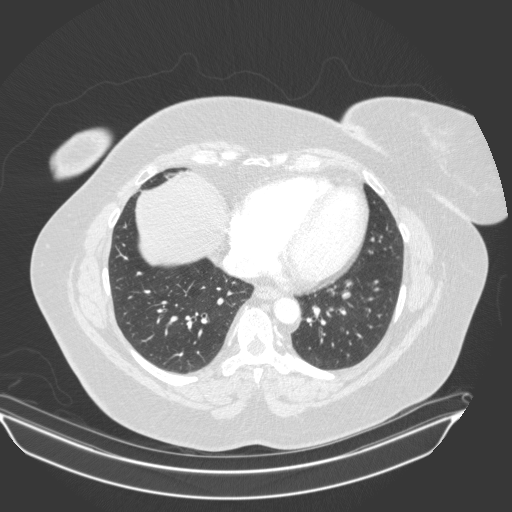
[im 76/185  mediastinal]
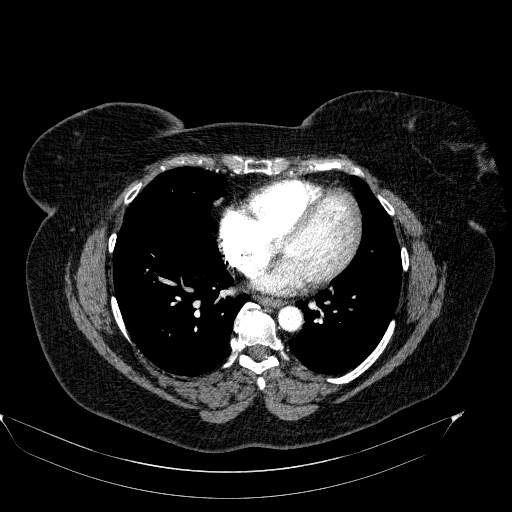
[im 87/185  lung]
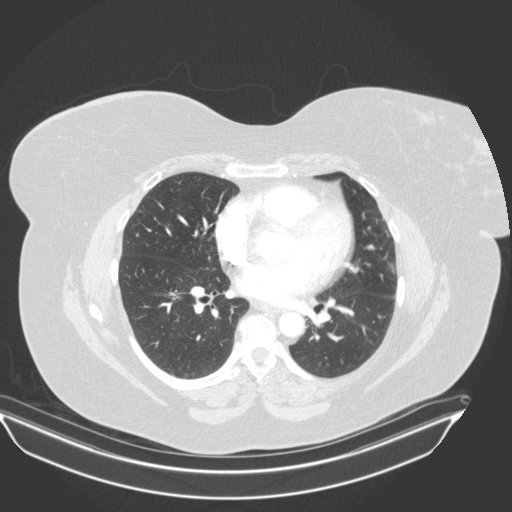
[im 98/185  mediastinal]
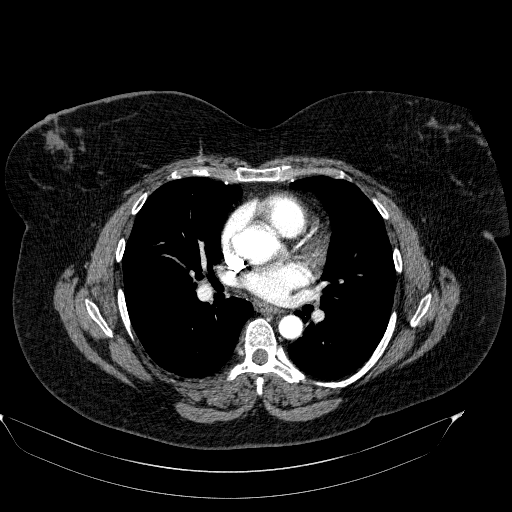
[im 109/185  lung]
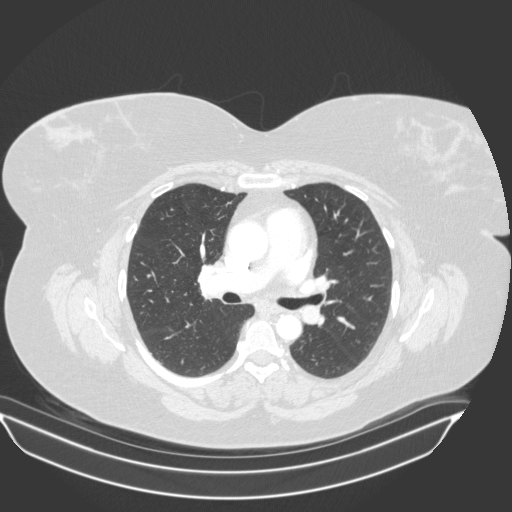
[im 120/185  mediastinal]
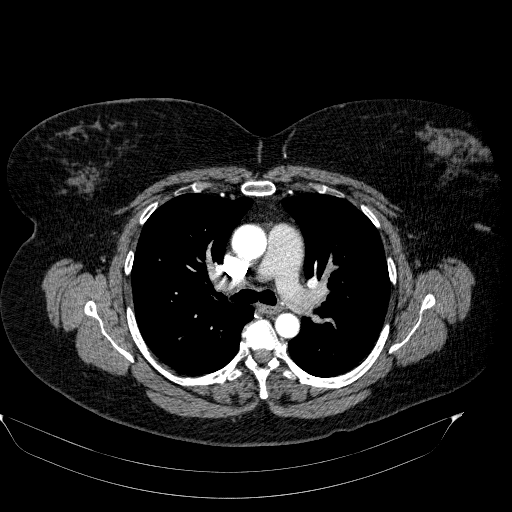
[im 123/185  lung]
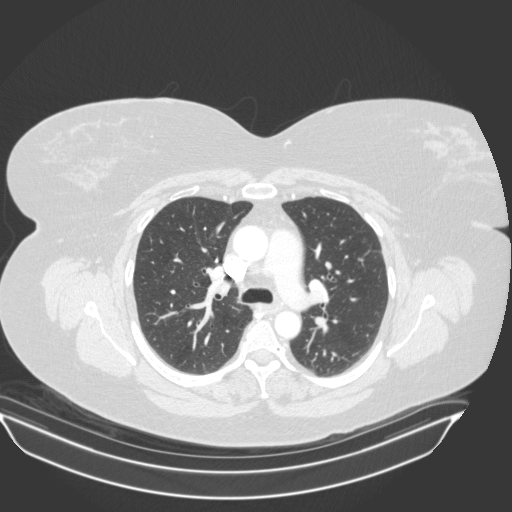
[im 130/185  mediastinal]
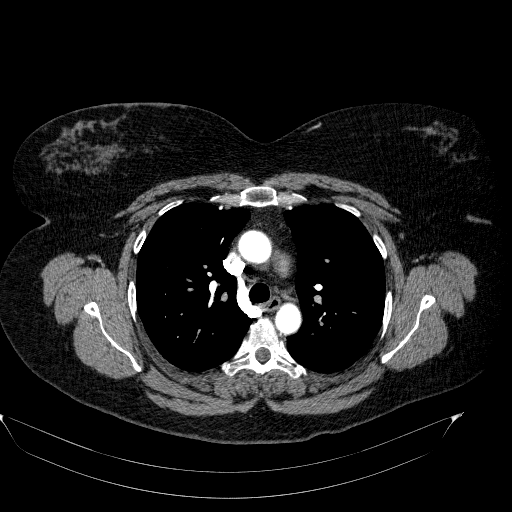
[im 141/185  lung]
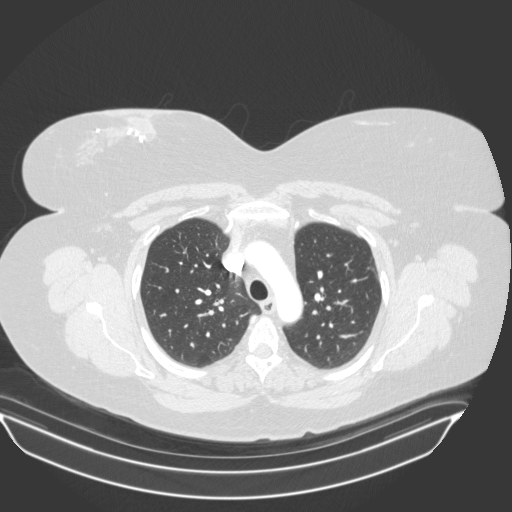
[im 152/185  mediastinal]
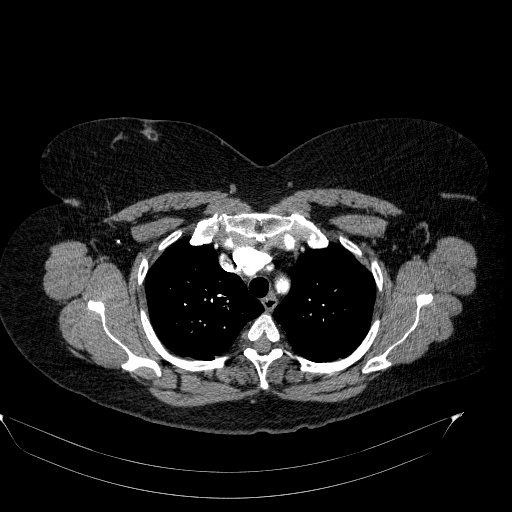
[im 163/185  lung]
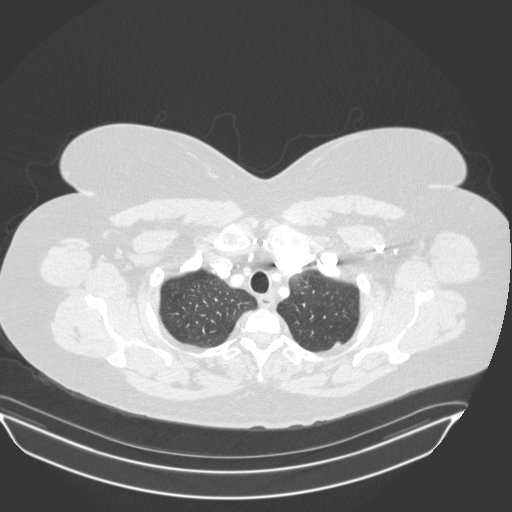
[im 174/185  mediastinal]
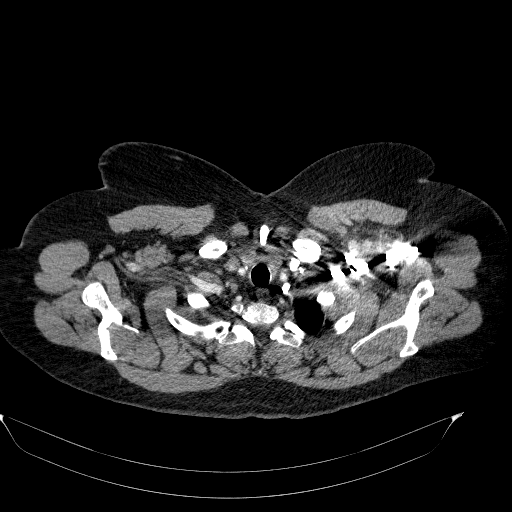

[18 of 31 positions shown; findings below may reference images not displayed]

FINDINGS: The exam is limited by suboptimal contrast bolus timing. There is no
gross pulmonary embolus in the main pulmonary arteries. The aorta is
normal. The heart size is normal. There is no pericardial effusion.

There is no pulmonary mass, pneumonia, or pleural effusion.

Postsurgical changes are identified in the medial upper right breast
consistent with patient's known prior lumpectomy. Images of the
visualized upper abdominal structures demonstrate fatty infiltration
of liver. Degenerative joint changes of the spine are identified.

Review of the MIP images confirms the above findings.
IMPRESSION: Exam is limited by sub optimal contrast bolus timing. No gross
pulmonary embolus is identified in the main pulmonary arteries.

No acute abnormality identified in the chest.

Fatty infiltration of liver.

## 2016-12-19 ENCOUNTER — Other Ambulatory Visit: Payer: Self-pay | Admitting: Hematology and Oncology

## 2016-12-19 ENCOUNTER — Other Ambulatory Visit: Payer: Self-pay | Admitting: Oncology

## 2016-12-19 DIAGNOSIS — Z9889 Other specified postprocedural states: Secondary | ICD-10-CM

## 2016-12-19 DIAGNOSIS — Z853 Personal history of malignant neoplasm of breast: Secondary | ICD-10-CM

## 2016-12-21 ENCOUNTER — Other Ambulatory Visit: Payer: Self-pay

## 2016-12-21 DIAGNOSIS — C50211 Malignant neoplasm of upper-inner quadrant of right female breast: Secondary | ICD-10-CM

## 2016-12-21 MED ORDER — ANASTROZOLE 1 MG PO TABS: ORAL_TABLET | ORAL | 1 refills | Status: DC

## 2016-12-22 ENCOUNTER — Telehealth: Payer: Self-pay

## 2016-12-22 DIAGNOSIS — C50211 Malignant neoplasm of upper-inner quadrant of right female breast: Secondary | ICD-10-CM

## 2016-12-22 MED ORDER — ANASTROZOLE 1 MG PO TABS: ORAL_TABLET | ORAL | 1 refills | Status: DC

## 2016-12-22 NOTE — Telephone Encounter (Signed)
Incoming fax from [facility] for anastrozole refill. Noted refill had been sent to [facility]. Called pt and clarified. She has moved to [place] but still comes to see Dr Jana Hakim. RX sent to [facility]. Pt stated she had called [facility] [place] [place] and already cancelled that order. Pharmacy changed on record.

## 2017-02-01 ENCOUNTER — Ambulatory Visit
Admission: RE | Admit: 2017-02-01 | Discharge: 2017-02-01 | Disposition: A | Discharge status: Home / Self Care | Payer: 59 | Source: Ambulatory Visit | Attending: Oncology | Admitting: Oncology

## 2017-02-01 DIAGNOSIS — Z9889 Other specified postprocedural states: Secondary | ICD-10-CM

## 2017-02-01 DIAGNOSIS — Z853 Personal history of malignant neoplasm of breast: Secondary | ICD-10-CM

## 2017-02-01 HISTORY — DX: Personal history of irradiation: Z92.3

## 2017-06-02 ENCOUNTER — Other Ambulatory Visit: Payer: Self-pay | Admitting: *Deleted

## 2017-06-02 ENCOUNTER — Telehealth: Payer: Self-pay | Admitting: Oncology

## 2017-06-02 DIAGNOSIS — Z170 Estrogen receptor positive status [ER+]: Principal | ICD-10-CM

## 2017-06-02 DIAGNOSIS — C50211 Malignant neoplasm of upper-inner quadrant of right female breast: Secondary | ICD-10-CM

## 2017-06-05 ENCOUNTER — Other Ambulatory Visit: Payer: Self-pay

## 2017-06-05 ENCOUNTER — Ambulatory Visit: Payer: Self-pay | Admitting: Oncology

## 2017-06-05 ENCOUNTER — Encounter: Payer: Self-pay | Admitting: Oncology

## 2017-07-11 ENCOUNTER — Other Ambulatory Visit: Payer: Self-pay | Admitting: Oncology

## 2017-07-11 DIAGNOSIS — C50211 Malignant neoplasm of upper-inner quadrant of right female breast: Secondary | ICD-10-CM

## 2018-01-03 ENCOUNTER — Other Ambulatory Visit: Payer: Self-pay | Admitting: Oncology

## 2018-01-03 DIAGNOSIS — Z853 Personal history of malignant neoplasm of breast: Secondary | ICD-10-CM

## 2018-02-25 NOTE — Progress Notes (Signed)
ID: Jorje Guild OB: 1960/11/08  MR#: [id number]  QMV#:[id number]  PCP: Dr Lyanne Co GYN:  Debbora Dus NP SU: Abigail Miyamoto OTHER MD: Chipper Herb, Rupinder Tiney Rouge.  CHIEF COMPLAINT: Estrogen receptor positive breast cancer  CURRENT TREATMENT: Anastrozole  BREAST CANCER HISTORY: From the original intake note:  "Jean [name]" had routine mammographic screening 10/02/2012 showing dense breasts (category C.). A possible distortion was noted in the right breast and a diagnostic right mammography and ultrasonography on 01/02/2013 confirmed a persistent area of distortion in the upper inner quadrant of the right breast measuring approximately 1 cm. This was not palpable. Ultrasound was not informative and showed no axillary adenopathy. 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  Bilateral breast MRIs 01/28/2013 showed a large hematoma at the biopsy site, measuring up to 6.1 cm, with minimal patchy enhancing nodularity at the periphery. There was a 1.7 cm lymph node in the right axillary region with a preserved fatty hilum but slightly thickened cortex. There were no other findings of concern.  The patient's subsequent history is as detailed below  INTERVAL HISTORY: Jean [name] for followup of her estrogen receptor positive breast cancer. After her last visit, she was unable to refill her anastrozole, [name] she has not taken it in the last year.  It does not appear that she called here to get that done.  She has occasional hot flashes, but they are not intense. She also has occasional vaginal dryness, and she uses lubricants to aid this.   She is scheduled for bilateral mammography [name] at [facility] at 1pm.    ROS: Jean [name]. She switched PCPs and now follows up  with Dr. Jeanie Cooks in [place]. She has lost 41 lbs since August 2018. She is taking metformin and ozempic. She feels slightly nauseated and she gets full faster from taking these. She cut her soda and caffeine intake. She is now working at [employer], having more of a work load but is less stressful.  She denies unusual headaches, visual changes, nausea, vomiting, or dizziness. There has been no unusual cough, phlegm production, or pleurisy. This been no change in bowel or bladder habits. She denies unexplained fatigue or unexplained weight loss, bleeding, rash, or fever. A detailed review of systems was otherwise stable.    PAST MEDICAL HISTORY: Past Medical History:  Diagnosis Date  . Arthritis   . Breast cancer (HCC)   . COPD with asthma (HCC)   . Depression    takes Viibryd daily as well as Abilify  . GERD (gastroesophageal reflux disease)    related to Xarelto;takes Omeprazole daily  . Hx of radiation therapy 03/25/13- 05/06/13   right breast 4500 cGy 25 sessions, right breast boost 1000 cGy 5 sessions  . Hyperlipidemia    takes Lipitor daily  . Joint pain   . Nasal congestion    takes Sudafed if needed)  . Nocturia   . Obesity   . Personal history of radiation therapy   . Pneumonia    hx of;last time around 1987  . PUD (peptic ulcer disease)    remote  . Pulmonary embolism (HCC) 2012   takes Xarelto daily  . Stress incontinence   . Vertigo    pt doesn't take any medication    PAST SURGICAL HISTORY: Past Surgical History:  Procedure Laterality Date  . ADENOIDECTOMY  1967  . BREAST BIOPSY  May 2014   stage I beast cancer  . BREAST LUMPECTOMY Right 02/07/2013  . BREAST LUMPECTOMY WITH NEEDLE LOCALIZATION AND AXILLARY SENTINEL LYMPH NODE BX Right 02/07/2013   Procedure: BREAST LUMPECTOMY WITH NEEDLE LOCALIZATION AND AXILLARY SENTINEL LYMPH NODE BX;  Surgeon: Shelly Rubenstein, MD;  Location: [facility] OR;  Service: General;  Laterality: Right;    FAMILY HISTORY Family History   Adopted: Yes  Problem Relation Age of Onset  . Breast cancer Maternal Aunt 60  . Breast cancer Maternal Grandmother        post menopausal breast cancer  . Breast cancer Cousin 40       possible bilateral cancer  . Stomach cancer Neg Hx   . Pancreatic cancer Neg Hx   . Colon cancer Neg Hx    the patient is adopted and has no information on her father's side of the family. Her biological mother is currently 20. Her biological mothers mother had a diagnosis of breast cancer but the patient does not know at what age. Also 1 maternal aunt was diagnosed with breast cancer as well as a first cousin, and a first cousin was diagnosed at age 78. The patient was tested for the BRCA mutation and is negative.  GYNECOLOGIC HISTORY:  Menarche age 54, perimenopause at around age 89. The patient then took hormone replacement for 5 years, quitting at age 54 when she developed a pulmonary embolus. She never took birth control pills. She is GX P0.  SOCIAL HISTORY:  Jean [name].In [place] she visits her adoptive mother [name] and her adoptive brother [name]. The patient's adoptive father is currently at [facility] following orthopedic surgery. There is a second adoptive brother. The patient herself is single, but married her partner in 2014 and moved in with her and her 4 children in [place]. She wishes to maintain her medical care here since her mother will not be moving and she will be coming to this area frequently.. The only pet at home is a Estate manager/land agent    ADVANCED DIRECTIVES: In place. The patient has named her adoptive mother, [name], as her healthcare power of attorney. Barbara's cell number is [phone]   HEALTH MAINTENANCE: Social History   Tobacco Use  . Smoking status: Never Smoker  . Smokeless tobacco: Never Used  Substance Use Topics  . Alcohol use: No  . Drug use: No     Colonoscopy:  PAP: 2014  Bone density: 12/03/2015 showed a T score of -1.0  normal  Lipid panel:  Allergies  Allergen Reactions  . Codeine Shortness Of Breath  . Mobic [Meloxicam] Shortness Of Breath    Current Outpatient Medications  Medication Sig Dispense Refill  . ADVAIR DISKUS 250-50 MCG/DOSE AEPB Inhale 1 puff into the lungs. Reported on 12/09/2015  11  . anastrozole (ARIMIDEX) 1 MG tablet TAKE 1 TABLET(1 MG) BY MOUTH DAILY 90 tablet 1  . ARIPiprazole (ABILIFY) 5 MG tablet Take 2.5 mg by mouth daily. Pt takes 1/2 tablet (2.5 mg) daily.    Marland Kitchen atorvastatin (LIPITOR) 20 MG tablet Take 20 mg by mouth daily.    . celecoxib (CELEBREX) 200 MG capsule Take 200 mg by mouth daily.     . cholecalciferol (VITAMIN D) 1000 units tablet Take 1,000 Units by mouth daily.    Marland Kitchen losartan (COZAAR) 25 MG tablet Take 1 tablet (25 mg total) by mouth daily.    Marland Kitchen  Vilazodone HCl (VIIBRYD) 40 MG TABS Take 40 mg by mouth daily.     Carlena Hurl 20 MG TABS tablet Take 1 tablet (20 mg total) by mouth 2 (two) times daily. 30 tablet 2   No current facility-administered medications for this visit.     OBJECTIVE: Middle-aged white woman who appears stated age  Vitals:   02/26/18 1118  BP: (!) 143/75  Pulse: 75  Resp: 18  Temp: 97.9 F (36.6 C)  SpO2: 95%     Body mass index is 45.91 kg/m.    ECOG FS: 1  Significant rosacea, as previously noted Sclerae unicteric, EOMs intact Oropharynx clear and moist No cervical or supraclavicular adenopathy Lungs no rales or rhonchi Heart regular rate and rhythm Abd soft, obese, nontender, positive bowel sounds MSK no focal spinal tenderness Neuro: nonfocal, well oriented, appropriate affect Breasts: The right breast is status post lumpectomy and radiation.  There is induration running medially from the surgical scar, as previously noted.  There is no erythema or tenderness.  There are no suspicious masses.  Left breast is benign.  Both axillae are benign.  LAB RESULTS:  CMP     Component Value Date/Time   NA 140 06/06/2016 1509   K 4.2  06/06/2016 1509   CL 105 02/06/2013 0828   CL 105 01/30/2013 0823   CO2 25 06/06/2016 1509   GLUCOSE 192 (H) 06/06/2016 1509   GLUCOSE 104 (H) 01/30/2013 0823   BUN 13.9 06/06/2016 1509   CREATININE 0.9 06/06/2016 1509   CALCIUM 9.1 06/06/2016 1509   PROT 7.4 06/06/2016 1509   ALBUMIN 3.4 (L) 06/06/2016 1509   AST 13 06/06/2016 1509   ALT 17 06/06/2016 1509   ALKPHOS 123 06/06/2016 1509   BILITOT 0.40 06/06/2016 1509   GFRNONAA 80 (L) 02/06/2013 0828   GFRAA >90 02/06/2013 0828    I No results found for: SPEP  Lab Results  Component Value Date   WBC 4.3 06/06/2016   NEUTROABS 2.4 06/06/2016   HGB 12.1 06/06/2016   HCT 35.9 06/06/2016   MCV 86.7 06/06/2016   PLT 164 06/06/2016      Chemistry      Component Value Date/Time   NA 140 06/06/2016 1509   K 4.2 06/06/2016 1509   CL 105 02/06/2013 0828   CL 105 01/30/2013 0823   CO2 25 06/06/2016 1509   BUN 13.9 06/06/2016 1509   CREATININE 0.9 06/06/2016 1509      Component Value Date/Time   CALCIUM 9.1 06/06/2016 1509   ALKPHOS 123 06/06/2016 1509   AST 13 06/06/2016 1509   ALT 17 06/06/2016 1509   BILITOT 0.40 06/06/2016 1509       No results found for: LABCA2  No components found for: LABCA125  No results for input(s): INR in the last 168 hours.  Urinalysis No results found for: COLORURINE  STUDIES: No results found.  ASSESSMENT: 57 y.o. BRCA negative  woman status post right breast biopsy 01/22/2013 for a clinical T1b NX, stage IA invasive ductal carcinoma, grade 2, estrogen receptor 90% positive, progesterone receptor 90% positive, with no HER-2 amplification and an MIB-1 of 10%.  (1) Status post right lumpectomy and sentinel lymph node sampling 02/07/2013 for a pT1b pN0, stage IA invasive ductal breast cancer, grade 2, repeat HER-2 again negative  (2) Oncotype score of 11 predicts a distant recurrence rate within 10 years of 8% if the patient's only systemic treatment is tamoxifen for 5  years  (3) adjuvant radiation  completed 04/30/2013  (4) anastrozole started October 2014;   (a)normal bone density November 2014  (b) normal bone density 12/03/2015 with a T score of -1.0   PLAN:  I was ready to discharge Jean [name] but it turns out that she has not been on anastrozole for the past year or [name].  Accordingly she is going back on that medication and she will see me one last time a year from now.  At that point she will get her "diploma".  She will have mammography [name] and again on the day she returns to see me a year from now.  I commended her on her new exercise and diet program.  If she persists on it she will change her health for the better.  She knows to call for any other issues that may develop before her next visit.  Ciro Tashiro, Valentino Hue, MD  02/26/18 11:31 AM Medical Oncology and Hematology [facility] [address] [place], [place] [address] Tel. [phone]    Fax. [phone]  Fonnie Birkenhead, am acting as scribe for Lowella Dell MD.  I, Ruthann Cancer MD, have reviewed the above documentation for accuracy and completeness, and I agree with the above.

## 2018-02-26 ENCOUNTER — Telehealth: Payer: Self-pay | Admitting: Oncology

## 2018-02-26 ENCOUNTER — Ambulatory Visit
Admission: RE | Admit: 2018-02-26 | Discharge: 2018-02-26 | Disposition: A | Discharge status: Home / Self Care | Payer: 59 | Source: Ambulatory Visit | Attending: Oncology | Admitting: Oncology

## 2018-02-26 ENCOUNTER — Inpatient Hospital Stay: Payer: 59 | Attending: Oncology | Admitting: Oncology

## 2018-02-26 VITALS — BP 143/75 | HR 75 | Temp 97.9°F | Resp 18 | Ht 65.0 in | Wt 275.9 lb

## 2018-02-26 DIAGNOSIS — Z170 Estrogen receptor positive status [ER+]: Secondary | ICD-10-CM | POA: Diagnosis not present

## 2018-02-26 DIAGNOSIS — L719 Rosacea, unspecified: Secondary | ICD-10-CM | POA: Diagnosis not present

## 2018-02-26 DIAGNOSIS — Z853 Personal history of malignant neoplasm of breast: Secondary | ICD-10-CM

## 2018-02-26 DIAGNOSIS — C50211 Malignant neoplasm of upper-inner quadrant of right female breast: Secondary | ICD-10-CM

## 2018-02-26 MED ORDER — ANASTROZOLE 1 MG PO TABS: ORAL_TABLET | ORAL | 1 refills | Status: DC

## 2018-02-26 NOTE — Telephone Encounter (Signed)
Gave patient Jean [name] and Calendar.  Patient wanted to schedule her appointment with the [facility], as she is going over there today.

## 2018-09-02 ENCOUNTER — Other Ambulatory Visit: Payer: Self-pay | Admitting: Oncology

## 2018-09-02 DIAGNOSIS — C50211 Malignant neoplasm of upper-inner quadrant of right female breast: Secondary | ICD-10-CM

## 2019-02-25 ENCOUNTER — Other Ambulatory Visit: Payer: Self-pay | Admitting: Oncology

## 2019-02-25 DIAGNOSIS — Z1231 Encounter for screening mammogram for malignant neoplasm of breast: Secondary | ICD-10-CM

## 2019-02-27 ENCOUNTER — Ambulatory Visit: Payer: Self-pay | Admitting: Oncology

## 2019-03-11 ENCOUNTER — Inpatient Hospital Stay: Payer: 59 | Attending: Oncology | Admitting: Oncology

## 2019-03-11 ENCOUNTER — Other Ambulatory Visit: Payer: Self-pay

## 2019-03-11 VITALS — BP 122/81 | HR 80 | Temp 98.7°F | Resp 18 | Wt 276.1 lb

## 2019-03-11 DIAGNOSIS — Z803 Family history of malignant neoplasm of breast: Secondary | ICD-10-CM | POA: Insufficient documentation

## 2019-03-11 DIAGNOSIS — C50211 Malignant neoplasm of upper-inner quadrant of right female breast: Secondary | ICD-10-CM | POA: Diagnosis not present

## 2019-03-11 DIAGNOSIS — E669 Obesity, unspecified: Secondary | ICD-10-CM | POA: Insufficient documentation

## 2019-03-11 DIAGNOSIS — R21 Rash and other nonspecific skin eruption: Secondary | ICD-10-CM | POA: Diagnosis not present

## 2019-03-11 DIAGNOSIS — Z923 Personal history of irradiation: Secondary | ICD-10-CM | POA: Insufficient documentation

## 2019-03-11 DIAGNOSIS — Z170 Estrogen receptor positive status [ER+]: Secondary | ICD-10-CM | POA: Insufficient documentation

## 2019-03-11 DIAGNOSIS — K760 Fatty (change of) liver, not elsewhere classified: Secondary | ICD-10-CM

## 2019-03-11 DIAGNOSIS — Z79811 Long term (current) use of aromatase inhibitors: Secondary | ICD-10-CM

## 2019-03-11 DIAGNOSIS — Z86711 Personal history of pulmonary embolism: Secondary | ICD-10-CM | POA: Diagnosis not present

## 2019-03-11 MED ORDER — ANASTROZOLE 1 MG PO TABS: ORAL_TABLET | ORAL | 1 refills | Status: DC

## 2019-03-11 MED ORDER — KETOCONAZOLE 2 % EX CREA: 1.0000 "application " | TOPICAL_CREAM | Freq: Every day | CUTANEOUS | 0 refills | Status: DC

## 2019-03-11 NOTE — Progress Notes (Signed)
ID: Jorje Guild OB: October 03, 1960  MR#: [id number]  LKG#:[id number]  Patient Care Team: System, Pcp Not In as PCP - General Kacen Mellinger, Valentino Hue, MD as Consulting Physician (Oncology) Leeroy Cha, MD as Consulting Physician (Obstetrics and Gynecology) Milagros Evener, MD as Consulting Physician (Psychiatry) OTHER MD: Dr. Lyanne Co (PCP)  CHIEF COMPLAINT: Estrogen receptor positive breast cancer  CURRENT TREATMENT: Anastrozole   INTERVAL HISTORY: Marisue Ivan returns today for followup of her estrogen receptor positive breast cancer.   She continues on anastrozole. She denies hot flashes. She notes vaginal dryness, for which she uses lubricant.  Since her last visit, she underwent bilateral diagnostic mammography with tomography at [facility] on 02/26/2018 showing: breast density category C; no evidence of malignancy in either breast.  She presented to the [facility] ED on 02/22/2019 with palpitations. She underwent chest CT, which showed: no definitive CT angiographic evidence of pulmonary embolism; evidence of chronic hepatocellular disease; multiple densities in the right breast that cannot be appropriately characterized by CT.  She also underwent Coronavirus testing that day, which was negative.  She is scheduled for bilateral screening mammogram on 04/05/2019.   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 She reports using her treadmill once a week. She states her A1C has dropped from 7 to 6. She reports her step-children are doing well, and notes a step-daughter that has been staying in her room playing video games. A detailed review of systems was otherwise stable.   BREAST CANCER HISTORY: From the original intake note:  "Marisue Ivan" had routine mammographic screening 10/02/2012 showing dense breasts (category C.). A possible distortion was noted in the right breast and a diagnostic right mammography and ultrasonography on 01/02/2013  confirmed a persistent area of distortion in the upper inner quadrant of the right breast measuring approximately 1 cm. This was not palpable. Ultrasound was not informative and showed no axillary adenopathy. 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  Bilateral breast MRIs 01/28/2013 showed a large hematoma at the biopsy site, measuring up to 6.1 cm, with minimal patchy enhancing nodularity at the periphery. There was a 1.7 cm lymph node in the right axillary region with a preserved fatty hilum but slightly thickened cortex. There were no other findings of concern.  The patient's subsequent history is as detailed below   PAST MEDICAL HISTORY: Past Medical History:  Diagnosis Date  . Arthritis   . Breast cancer (HCC)   . COPD with asthma (HCC)   . Depression    takes Viibryd daily as well as Abilify  . GERD (gastroesophageal reflux disease)    related to Xarelto;takes Omeprazole daily  . Hx of radiation therapy 03/25/13- 05/06/13   right breast 4500 cGy 25 sessions, right breast boost 1000 cGy 5 sessions  . Hyperlipidemia    takes Lipitor daily  . Joint pain   . Nasal congestion    takes Sudafed if needed)  . Nocturia   . Obesity   . Personal history of radiation therapy   . Pneumonia    hx of;last time around 1987  . PUD (peptic ulcer disease)    remote  . Pulmonary embolism (HCC) 2012   takes Xarelto daily  . Stress incontinence   . Vertigo    pt doesn't take any medication    PAST  SURGICAL HISTORY: Past Surgical History:  Procedure Laterality Date  . ADENOIDECTOMY  1967  . BREAST BIOPSY  May 2014   stage I beast cancer  . BREAST LUMPECTOMY Right 02/07/2013  . BREAST LUMPECTOMY WITH NEEDLE LOCALIZATION AND AXILLARY SENTINEL LYMPH NODE BX Right 02/07/2013   Procedure: BREAST LUMPECTOMY WITH NEEDLE  LOCALIZATION AND AXILLARY SENTINEL LYMPH NODE BX;  Surgeon: Shelly Rubenstein, MD;  Location: [facility] OR;  Service: General;  Laterality: Right;    FAMILY HISTORY Family History  Adopted: Yes  Problem Relation Age of Onset  . Breast cancer Maternal Aunt 60  . Breast cancer Maternal Grandmother        post menopausal breast cancer  . Breast cancer Cousin 40       possible bilateral cancer  . Stomach cancer Neg Hx   . Pancreatic cancer Neg Hx   . Colon cancer Neg Hx    the patient is adopted and has no information on her father's side of the family. Her biological mother is currently 26. Her biological mothers mother had a diagnosis of breast cancer but the patient does not know at what age. Also 1 maternal aunt was diagnosed with breast cancer as well as a first cousin, and a first cousin was diagnosed at age 28. The patient was tested for the BRCA mutation and is negative.   GYNECOLOGIC HISTORY:  Menarche age 34, perimenopause at around age 39. The patient then took hormone replacement for 5 years, quitting at age 17 when she developed a pulmonary embolus. She never took birth control pills. She is GX P0.   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 In [place] she visits her adoptive mother [name] and her adoptive brother [name]. The patient's adoptive father is currently at [facility] following orthopedic surgery. There is a second adoptive brother. The patient herself is single, but married her partner in 2014 and moved in with her and her 4 children in [place]. She wishes to maintain her medical care here since her mother will not be moving and she will be coming to this area frequently.. The only pet at home is a Estate manager/land agent    ADVANCED DIRECTIVES: In place. The patient has named her adoptive mother, [name], as her healthcare power of attorney. Barbara's cell number is [phone]   HEALTH MAINTENANCE: Social History    Tobacco Use  . Smoking status: Never Smoker  . Smokeless tobacco: Never Used  Substance Use Topics  . Alcohol use: No  . Drug use: No     Colonoscopy:  PAP: 2014  Bone density: 12/03/2015 showed a T score of -1.0 normal  Lipid panel:  Allergies  Allergen Reactions  . Codeine Shortness Of Breath  . Mobic [Meloxicam] Shortness Of Breath    Current Outpatient Medications  Medication Sig Dispense Refill  . anastrozole (ARIMIDEX) 1 MG tablet TAKE 1 TABLET BY MOUTH EVERY DAY 90 tablet 1  . apixaban (ELIQUIS) 2.5 MG TABS tablet Take 1 tablet (2.5 mg total) by mouth 2 (two) times daily. 60 tablet   . ARIPiprazole (ABILIFY) 5 MG tablet Take 2.5 mg by mouth daily. Pt takes 1/2 tablet (2.5 mg) daily.    Marland Kitchen atorvastatin (LIPITOR) 20 MG tablet Take 20 mg by mouth daily.    . celecoxib (CELEBREX) 200 MG capsule Take 200 mg by mouth daily.     . cholecalciferol (VITAMIN D) 1000 units tablet Take 1,000 Units by mouth daily.    Marland Kitchen  ketoconazole (NIZORAL) 2 % cream Apply 1 application topically daily. 15 g 0  . losartan (COZAAR) 25 MG tablet Take 1 tablet (25 mg total) by mouth daily.    . metFORMIN (GLUCOPHAGE) 1000 MG tablet Take 1 tablet (1,000 mg total) by mouth 2 (two) times daily with a meal.    . Semaglutide, 1 MG/DOSE, (OZEMPIC, 1 MG/DOSE,) 2 MG/1.5ML SOPN Inject into the skin.    . Vilazodone HCl (VIIBRYD) 40 MG TABS Take 40 mg by mouth daily.      No current facility-administered medications for this visit.     OBJECTIVE: Morbidly obese white woman in no acute distress  Vitals:   03/11/19 1410  BP: 122/81  Pulse: 80  Resp: 18  Temp: 98.7 F (37.1 C)  SpO2: 95%     Body mass index is 45.95 kg/m.    ECOG FS: 1  Rosacea as previously noted Sclerae unicteric, EOMs intact Wearing a mask No cervical or supraclavicular adenopathy Lungs no rales or rhonchi Heart regular rate and rhythm Abd soft, obese, nontender, positive bowel sounds MSK no focal spinal tenderness, no upper  extremity lymphedema Neuro: nonfocal, well oriented, appropriate affect Breasts: The right breast is status post lumpectomy followed by radiation.  There is no change and no evidence of local recurrence.  The left breast is benign.  Both axillae are benign.  LAB RESULTS:  CMP     Component Value Date/Time   NA 140 06/06/2016 1509   K 4.2 06/06/2016 1509   CL 105 02/06/2013 0828   CL 105 01/30/2013 0823   CO2 25 06/06/2016 1509   GLUCOSE 192 (H) 06/06/2016 1509   GLUCOSE 104 (H) 01/30/2013 0823   BUN 13.9 06/06/2016 1509   CREATININE 0.9 06/06/2016 1509   CALCIUM 9.1 06/06/2016 1509   PROT 7.4 06/06/2016 1509   ALBUMIN 3.4 (L) 06/06/2016 1509   AST 13 06/06/2016 1509   ALT 17 06/06/2016 1509   ALKPHOS 123 06/06/2016 1509   BILITOT 0.40 06/06/2016 1509   GFRNONAA 80 (L) 02/06/2013 0828   GFRAA >90 02/06/2013 0828    I No results found for: SPEP  Lab Results  Component Value Date   WBC 4.3 06/06/2016   NEUTROABS 2.4 06/06/2016   HGB 12.1 06/06/2016   HCT 35.9 06/06/2016   MCV 86.7 06/06/2016   PLT 164 06/06/2016      Chemistry      Component Value Date/Time   NA 140 06/06/2016 1509   K 4.2 06/06/2016 1509   CL 105 02/06/2013 0828   CL 105 01/30/2013 0823   CO2 25 06/06/2016 1509   BUN 13.9 06/06/2016 1509   CREATININE 0.9 06/06/2016 1509      Component Value Date/Time   CALCIUM 9.1 06/06/2016 1509   ALKPHOS 123 06/06/2016 1509   AST 13 06/06/2016 1509   ALT 17 06/06/2016 1509   BILITOT 0.40 06/06/2016 1509       No results found for: LABCA2  No components found for: LABCA125  No results for input(s): INR in the last 168 hours.  Urinalysis No results found for: COLORURINE  STUDIES: No results found.   Chest CT [facility], 02/22/2019) Edi, Radiologyin - 02/22/2019 12:14 PM EDT   CTA chest-- pulmonary angiogram with intravascular contrast dated 02/22/2019   History: Intermediate probability for pulmonary embolus based on [place]  criteria.  Comparison: Chest radiograph same date  Bolus tracking technique utilized. Following bolus intravenous administration of intravenous contrast timed for optimal opacification of the  pulmonary arterial structures, routine CT angiography was performed in the axial plane. Multiplanar reformatted images including 3-D volume rendering, and MIP reconstruction images were generated at an independent workstation to aid in detailed assessment of the pulmonary vascular structures.  One or more of the following radiation dose techniques were used for this examination: Automated exposure control, adjustment of the mA and/or kV according to patient size, use of iterative reconstruction technique.  Findings:  There is heterogeneous opacification of the pulmonary arteries throughout, limiting assessment. No definitive filling defect identified to suggest acute pulmonary embolus within these limitations. There are no signs of acute right heart strain.  No airspace consolidation, pneumothorax, or sizable pleural fluid collection identified. The central airways are patent.  The heart is not enlarged. Coronary artery calcifications are present. Please note-- the study is not timed appropriately nor reconstructed appropriately for evaluation of the coronary arteries, and therefore does not represent a diagnostic evaluation of such.  There is no pericardial fluid identified.   No pathologically enlarged thoracic  lymph nodes are identified (based upon short axis dimension criteria).  The thoracic aorta is normal in caliber and contour.   Limited imaging of the uppermost abdomen demonstrates no acute abnormalities. Diffuse low-density of the liver is nonspecific but suggestive of hepatic steatosis. Note that this does reduce sensitivity for detection of low density liver lesions. A nodular liver surface contour can be seen with cirrhosis in the appropriate clinical setting.  No aggressive osseous process  identified.  There is an ovoid peripherally calcified density in the right breast surrounding multiple biopsy clips measuring 2.9 cm series 5 image 54. Characterization is not otherwise achieved by CT and appropriate follow-up by means of mammography may be warranted in the setting of breast carcinoma. Additional nodularity in the subareolar breast measures 2.6 cm series 5 image 105.  Impression:   No definitive CT angiographic evidence of pulmonary embolism, within the limits of significant bullous heterogeneity resulting in patchy filling of the pulmonary arteries.  Evidence of chronic hepatocellular disease as above described.  Additional Findings That May Require Follow-Up: See above comments regarding multiple densities in the right breast that may warrant appropriate mammographic and/or clinical follow-up. These cannot be appropriately characterized by CT (in the absence of prior baseline imaging).   ASSESSMENT: 58 y.o. BRCA negative [place] woman status post right breast biopsy 01/22/2013 for a clinical T1b NX, stage IA invasive ductal carcinoma, grade 2, estrogen receptor 90% positive, progesterone receptor 90% positive, with no HER-2 amplification and an MIB-1 of 10%.  (1) Status post right lumpectomy and sentinel lymph node sampling 02/07/2013 for a pT1b pN0, stage IA invasive ductal breast cancer, grade 2, repeat HER-2 again negative  (2) Oncotype score of 11 predicts a distant recurrence rate within 10 years of 8% if the patient's only systemic treatment is tamoxifen for 5 years  (3) adjuvant radiation completed 04/30/2013  (4) anastrozole started October 2014-to be continued through 2022 (total 7 years)  (a) normal bone density November 2014  (b) normal bone density 12/03/2015 with a T score of -1.0  (c) bone density August 2020   PLAN:  Marisue Ivan is now a little over 6 years out from definitive surgery for her breast cancer with no evidence of disease recurrence.  This is very  favorable.  She is tolerating anastrozole well.  The plan is to continue for a total of 7 years.  With regards to that recall that there was one whole year where she missed the drug and  therefore the total will be 8 years from the start of the drug  She is already scheduled for mammography next month.  I have added a bone density to that.  I wrote her for ketoconazole cream given the slight left inframammary rash.  I recommended she use the treadmill a minimum of 3 times a week.  She will return to see me September of next year.  She knows to call for any other issue that may develop before that visit.  Rosalina Dingwall, Valentino Hue, MD  03/11/19 3:01 PM Medical Oncology and Hematology [facility] [address] [place], [place] [address] Tel. [phone]    Fax. [phone]   I, Mickie Bail, am acting as scribe for Dr. Valentino Hue. Sissy Goetzke.  I, Ruthann Cancer MD, have reviewed the above documentation for accuracy and completeness, and I agree with the above.

## 2019-03-12 ENCOUNTER — Telehealth: Payer: Self-pay | Admitting: Oncology

## 2019-03-12 NOTE — Telephone Encounter (Signed)
I talk with patient regarding schedule  

## 2019-04-05 ENCOUNTER — Ambulatory Visit: Payer: Self-pay

## 2019-12-01 ENCOUNTER — Other Ambulatory Visit: Payer: Self-pay | Admitting: Oncology

## 2019-12-01 DIAGNOSIS — C50211 Malignant neoplasm of upper-inner quadrant of right female breast: Secondary | ICD-10-CM

## 2020-03-17 ENCOUNTER — Other Ambulatory Visit: Payer: Self-pay | Admitting: Oncology

## 2020-03-17 DIAGNOSIS — C50211 Malignant neoplasm of upper-inner quadrant of right female breast: Secondary | ICD-10-CM

## 2020-04-10 ENCOUNTER — Telehealth: Payer: Self-pay | Admitting: Adult Health

## 2020-04-10 NOTE — Telephone Encounter (Signed)
Rescheduled 9/7 appt per provider PAL day. Pt confirmed new appt date and time.

## 2020-05-05 ENCOUNTER — Ambulatory Visit: Payer: 59 | Admitting: Oncology

## 2020-05-06 ENCOUNTER — Ambulatory Visit: Payer: 59 | Admitting: Adult Health

## 2020-05-27 NOTE — Progress Notes (Signed)
ID: Jean [name] OB: 01/06/1961  MR#: [id number]  FWY#:[id number]  Patient Care Team: Pcp, No as PCP - General Magrinat, Virgie Dad, MD as Consulting Physician (Oncology) Allene Dillon, MD as Consulting Physician (Obstetrics and Gynecology) Chucky May, MD as Consulting Physician (Psychiatry) OTHER MD: Dr. Arne Cleveland (PCP)  CHIEF COMPLAINT: Estrogen receptor positive breast cancer  CURRENT TREATMENT: Anastrozole   INTERVAL HISTORY: Jean [name] returns today for followup of her estrogen receptor positive breast cancer.   She continues on anastrozole. She tolerates this well.  She does have arthritis in her feet that she takes celebrex for at night.  Her most recent mammogram was on 06/19/2019 and showed no evidence of malignancy and breast density category C.   Her most recent bone density was completed 12/03/2015 and was normal.    ROS: Jean [name] is doing well today.  She has no new health issues.  She continues on Xarelto secondary to h/o DVTs and PE in 2014, she has subsequent lymphedema and her legs are wrapped.  This makes it difficult to exercise right now, however she does have a treadmill that she walks on when she can.  She is up to date with PCP visits and sees her endocrinologist every 3 months for labs and f/u of her diabetes.  She has no new concerns today and a detailed ROS was otherwise non contributory.     BREAST CANCER HISTORY: From the original intake note:  "Jean [name]" had routine mammographic screening 10/02/2012 showing dense breasts (category C.). A possible distortion was noted in the right breast and a diagnostic right mammography and ultrasonography on 01/02/2013 confirmed a persistent area of distortion in the upper inner quadrant of the right breast measuring approximately 1 cm. This was not palpable. Ultrasound was not informative and showed no axillary adenopathy. Stereotactic biopsy of the right breast mass 01/22/2013 showed an invasive ductal carcinoma, grade  2, estrogen receptor 90%, progesterone receptor 90%, with no HER-2 amplification (ratio 1.6 by CISH) and an MIB-1 of 10%.  Bilateral breast MRIs 01/28/2013 showed a large hematoma at the biopsy site, measuring up to 6.1 cm, with minimal patchy enhancing nodularity at the periphery. There was a 1.7 cm lymph node in the right axillary region with a preserved fatty hilum but slightly thickened cortex. There were no other findings of concern.  The patient's subsequent history is as detailed below   PAST MEDICAL HISTORY: Past Medical History:  Diagnosis Date  . Arthritis   . Breast cancer ([place])   . COPD with asthma ([place])   . Depression    takes Viibryd daily as well as Abilify  . GERD (gastroesophageal reflux disease)    related to Xarelto;takes Omeprazole daily  . Hx of radiation therapy 03/25/13- 05/06/13   right breast 4500 cGy 25 sessions, right breast boost 1000 cGy 5 sessions  . Hyperlipidemia    takes Lipitor daily  . Joint pain   . Nasal congestion    takes Sudafed if needed)  . Nocturia   . Obesity   . Personal history of radiation therapy   . Pneumonia    hx of;last time around 1987  . PUD (peptic ulcer disease)    remote  . Pulmonary embolism ([place]) 2012   takes Xarelto daily  . Stress incontinence   . Vertigo    pt doesn't take any medication    PAST SURGICAL HISTORY: Past Surgical History:  Procedure Laterality Date  . ADENOIDECTOMY  1967  . BREAST BIOPSY  May 2014  stage I beast cancer  . BREAST LUMPECTOMY Right 02/07/2013  . BREAST LUMPECTOMY WITH NEEDLE LOCALIZATION AND AXILLARY SENTINEL LYMPH NODE BX Right 02/07/2013   Procedure: BREAST LUMPECTOMY WITH NEEDLE LOCALIZATION AND AXILLARY SENTINEL LYMPH NODE BX;  Surgeon: Harl Bowie, MD;  Location: [facility];  Service: General;  Laterality: Right;    FAMILY HISTORY Family History  Adopted: Yes  Problem Relation Age of Onset  . Breast cancer Maternal Aunt 60  . Breast cancer Maternal Grandmother         post menopausal breast cancer  . Breast cancer Cousin 40       possible bilateral cancer  . Stomach cancer Neg Hx   . Pancreatic cancer Neg Hx   . Colon cancer Neg Hx    the patient is adopted and has no information on her father's side of the family. Her biological mother is currently 56. Her biological mothers mother had a diagnosis of breast cancer but the patient does not know at what age. Also 1 maternal aunt was diagnosed with breast cancer as well as a first cousin, and a first cousin was diagnosed at age 69. The patient was tested for the BRCA mutation and is negative.   GYNECOLOGIC HISTORY:  Menarche age 14, perimenopause at around age 20. The patient then took hormone replacement for 5 years, quitting at age 45 when she developed a pulmonary embolus. She never took birth control pills. She is GX P0.   SOCIAL HISTORY:  Jean [name] works in Designer, multimedia support with the [employer] (updated 02/2019). In [place] she visits her adoptive mother Jean [name] and her adoptive brother Jean [name]. The patient's adoptive father is currently at [facility] following orthopedic surgery. There is a second adoptive brother. The patient herself is single, but married her partner in 2014 and moved in with her and her 4 children in [place]. She wishes to maintain her medical care here since her mother will not be moving and she will be coming to this area frequently.. The only pet at home is a Neurosurgeon    ADVANCED DIRECTIVES: In place. The patient has named her adoptive mother, Jean [name], as her healthcare power of attorney. Barbara's cell number is [phone]   HEALTH MAINTENANCE: Social History   Tobacco Use  . Smoking status: Never Smoker  . Smokeless tobacco: Never Used  Substance Use Topics  . Alcohol use: No  . Drug use: No     Colonoscopy:  PAP: 2014  Bone density: 12/03/2015 showed a T score of -1.0 normal  Lipid panel:  Allergies  Allergen Reactions  . Codeine Shortness Of Breath   . Mobic [Meloxicam] Shortness Of Breath    Current Outpatient Medications  Medication Sig Dispense Refill  . amphetamine-dextroamphetamine (ADDERALL) 10 MG tablet Take 1 tablet by mouth daily.    Marland Kitchen anastrozole (ARIMIDEX) 1 MG tablet TAKE ONE TABLET BY MOUTH ONCE DAILY 90 tablet 0  . ARIPiprazole (ABILIFY) 5 MG tablet Take 2.5 mg by mouth daily. Pt takes 1/2 tablet (2.5 mg) daily.    Marland Kitchen atorvastatin (LIPITOR) 20 MG tablet Take 20 mg by mouth daily.    . celecoxib (CELEBREX) 200 MG capsule Take 200 mg by mouth daily.     . cholecalciferol (VITAMIN D) 1000 units tablet Take 1,000 Units by mouth daily.    Marland Kitchen ketoconazole (NIZORAL) 2 % cream Apply 1 application topically daily. 15 g 0  . losartan (COZAAR) 25 MG tablet Take 1 tablet (25 mg total) by mouth daily.    Marland Kitchen  metFORMIN (GLUCOPHAGE) 1000 MG tablet Take 1 tablet (1,000 mg total) by mouth 2 (two) times daily with a meal.    . Semaglutide, 1 MG/DOSE, (OZEMPIC, 1 MG/DOSE,) 2 MG/1.5ML SOPN Inject into the skin.    . Vilazodone HCl (VIIBRYD) 40 MG TABS Take 40 mg by mouth daily.     Alveda Reasons 2.5 MG TABS tablet Take by mouth.     No current facility-administered medications for this visit.    OBJECTIVE:  Vitals:   05/29/20 0910  BP: (!) 152/78  Pulse: 85  Resp: 18  Temp: (!) 97 F (36.1 C)  SpO2: 96%     Body mass index is 46.64 kg/m.    ECOG FS: 1 GENERAL: Patient is a well appearing female in no acute distress HEENT:  Sclerae anicteric.  Oropharynx clear and moist. No ulcerations or evidence of oropharyngeal candidiasis. Neck is supple.  NODES:  No cervical, supraclavicular, or axillary lymphadenopathy palpated.  BREAST EXAM:  Right breast s/p lumpectomy and radiation, left breast benign LUNGS:  Clear to auscultation bilaterally.  No wheezes or rhonchi. HEART:  Regular rate and rhythm. No murmur appreciated. ABDOMEN:  Soft, nontender.  Positive, normoactive bowel sounds. No organomegaly palpated. MSK:  No focal spinal tenderness  to palpation. Full range of motion bilaterally in the upper extremities. EXTREMITIES:  No peripheral edema.   SKIN:  Clear with no obvious rashes or skin changes. No nail dyscrasia. NEURO:  Nonfocal. Well oriented.  Appropriate affect.    LAB RESULTS:  CMP     Component Value Date/Time   NA 140 06/06/2016 1509   K 4.2 06/06/2016 1509   CL 105 02/06/2013 0828   CL 105 01/30/2013 0823   CO2 25 06/06/2016 1509   GLUCOSE 192 (H) 06/06/2016 1509   GLUCOSE 104 (H) 01/30/2013 0823   BUN 13.9 06/06/2016 1509   CREATININE 0.9 06/06/2016 1509   CALCIUM 9.1 06/06/2016 1509   PROT 7.4 06/06/2016 1509   ALBUMIN 3.4 (L) 06/06/2016 1509   AST 13 06/06/2016 1509   ALT 17 06/06/2016 1509   ALKPHOS 123 06/06/2016 1509   BILITOT 0.40 06/06/2016 1509   GFRNONAA 80 (L) 02/06/2013 0828   GFRAA >90 02/06/2013 0828    I No results found for: SPEP  Lab Results  Component Value Date   WBC 4.1 05/29/2020   NEUTROABS 2.3 05/29/2020   HGB 12.0 05/29/2020   HCT 36.2 05/29/2020   MCV 88.9 05/29/2020   PLT 163 05/29/2020      Chemistry      Component Value Date/Time   NA 140 06/06/2016 1509   K 4.2 06/06/2016 1509   CL 105 02/06/2013 0828   CL 105 01/30/2013 0823   CO2 25 06/06/2016 1509   BUN 13.9 06/06/2016 1509   CREATININE 0.9 06/06/2016 1509      Component Value Date/Time   CALCIUM 9.1 06/06/2016 1509   ALKPHOS 123 06/06/2016 1509   AST 13 06/06/2016 1509   ALT 17 06/06/2016 1509   BILITOT 0.40 06/06/2016 1509       No results found for: LABCA2  No components found for: LABCA125  No results for input(s): INR in the last 168 hours.  Urinalysis No results found for: COLORURINE  STUDIES: Mammogram at [facility] on 06/19/2019.     IMPRESSION:  1. No evidence for malignancy. No interval change.   RECOMMENDATION:  Recommend monthly self breast and yearly physical examination. If no palpable change, recommend routine screening with 3-D mammography in  one year.    CATEGORY 2 Benign      ASSESSMENT: 59 y.o. BRCA negative [place] woman status post right breast biopsy 01/22/2013 for a clinical T1b NX, stage IA invasive ductal carcinoma, grade 2, estrogen receptor 90% positive, progesterone receptor 90% positive, with no HER-2 amplification and an MIB-1 of 10%.  (1) Status post right lumpectomy and sentinel lymph node sampling 02/07/2013 for a pT1b pN0, stage IA invasive ductal breast cancer, grade 2, repeat HER-2 again negative  (2) Oncotype score of 11 predicts a distant recurrence rate within 10 years of 8% if the patient's only systemic treatment is tamoxifen for 5 years  (3) adjuvant radiation completed 04/30/2013  (4) anastrozole started October 2014-to be continued through 2022 (total 7 years)  (a) normal bone density November 2014  (b) normal bone density 12/03/2015 with a T score of -1.0     PLAN:  Jean [name] is here for f/u of her right breast stage IA estrogen positive breast cancer.  She has no clinical or radiographic sign of breast cancer recurrence.  She will continue on Anastrozole daily and tolerates this well.  She had labs completed.  I reviewed with her that her CBC is normal, CMET is pending.  We discussed bone density testing.  She wants this done in [place] near where she lives and I asked my nurse to look into how to make this happen for her.    She will undergo mammogram later this month when due.    We discussed healthy diet and exercise.  She continues on anastrozole for one more year.  We discussed f/u next year and graduation versus long term survivorship.  She is going to think about this and when we see her next year she will let us know what she feels comfortable with in regards to her future f/u and breast cancer surveillance.  We will see her in one year for labs and f/u.  She knows to call for any questions that may arise between now and her next appointment.  We are happy to see her sooner if needed.   Total  encounter time: 30 minutes*  Wilber Bihari, NP 05/29/20 9:54 AM Medical Oncology and Hematology [facility] [place], [place] [address] Tel. [phone]    Fax. [phone]  *Total Encounter Time as defined by the Centers for Medicare and Medicaid Services includes, in addition to the face-to-face time of a patient visit (documented in the note above) non-face-to-face time: obtaining and reviewing outside history, ordering and reviewing medications, tests or procedures, care coordination (communications with other health care professionals or caregivers) and documentation in the medical record.

## 2020-05-29 ENCOUNTER — Other Ambulatory Visit: Payer: Self-pay

## 2020-05-29 ENCOUNTER — Encounter: Payer: Self-pay | Admitting: Adult Health

## 2020-05-29 ENCOUNTER — Other Ambulatory Visit: Payer: Self-pay | Admitting: Adult Health

## 2020-05-29 ENCOUNTER — Inpatient Hospital Stay: Payer: 59

## 2020-05-29 ENCOUNTER — Inpatient Hospital Stay: Payer: 59 | Attending: Oncology | Admitting: Adult Health

## 2020-05-29 ENCOUNTER — Telehealth: Payer: Self-pay | Admitting: Adult Health

## 2020-05-29 VITALS — BP 152/78 | HR 85 | Temp 97.0°F | Resp 18 | Ht 65.0 in | Wt 280.3 lb

## 2020-05-29 DIAGNOSIS — Z86711 Personal history of pulmonary embolism: Secondary | ICD-10-CM | POA: Insufficient documentation

## 2020-05-29 DIAGNOSIS — C50211 Malignant neoplasm of upper-inner quadrant of right female breast: Secondary | ICD-10-CM

## 2020-05-29 DIAGNOSIS — K219 Gastro-esophageal reflux disease without esophagitis: Secondary | ICD-10-CM | POA: Diagnosis not present

## 2020-05-29 DIAGNOSIS — E785 Hyperlipidemia, unspecified: Secondary | ICD-10-CM | POA: Diagnosis not present

## 2020-05-29 DIAGNOSIS — Z170 Estrogen receptor positive status [ER+]: Secondary | ICD-10-CM | POA: Diagnosis not present

## 2020-05-29 DIAGNOSIS — Z853 Personal history of malignant neoplasm of breast: Secondary | ICD-10-CM | POA: Insufficient documentation

## 2020-05-29 DIAGNOSIS — E669 Obesity, unspecified: Secondary | ICD-10-CM | POA: Insufficient documentation

## 2020-05-29 DIAGNOSIS — J449 Chronic obstructive pulmonary disease, unspecified: Secondary | ICD-10-CM | POA: Insufficient documentation

## 2020-05-29 DIAGNOSIS — Z79899 Other long term (current) drug therapy: Secondary | ICD-10-CM | POA: Insufficient documentation

## 2020-05-29 DIAGNOSIS — Z7984 Long term (current) use of oral hypoglycemic drugs: Secondary | ICD-10-CM | POA: Insufficient documentation

## 2020-05-29 DIAGNOSIS — F329 Major depressive disorder, single episode, unspecified: Secondary | ICD-10-CM | POA: Diagnosis not present

## 2020-05-29 DIAGNOSIS — Z7901 Long term (current) use of anticoagulants: Secondary | ICD-10-CM | POA: Insufficient documentation

## 2020-05-29 DIAGNOSIS — Z79811 Long term (current) use of aromatase inhibitors: Secondary | ICD-10-CM | POA: Diagnosis not present

## 2020-05-29 DIAGNOSIS — C50911 Malignant neoplasm of unspecified site of right female breast: Secondary | ICD-10-CM | POA: Diagnosis not present

## 2020-05-29 DIAGNOSIS — E2839 Other primary ovarian failure: Secondary | ICD-10-CM

## 2020-05-29 DIAGNOSIS — Z923 Personal history of irradiation: Secondary | ICD-10-CM | POA: Insufficient documentation

## 2020-05-29 LAB — CBC WITH DIFFERENTIAL (CANCER CENTER ONLY)
Abs Immature Granulocytes: 0.01 10*3/uL (ref 0.00–0.07)
Basophils Absolute: 0 10*3/uL (ref 0.0–0.1)
Basophils Relative: 1 %
Eosinophils Absolute: 0.3 10*3/uL (ref 0.0–0.5)
Eosinophils Relative: 7 %
HCT: 36.2 % (ref 36.0–46.0)
Hemoglobin: 12 g/dL (ref 12.0–15.0)
Immature Granulocytes: 0 %
Lymphocytes Relative: 25 %
Lymphs Abs: 1 10*3/uL (ref 0.7–4.0)
MCH: 29.5 pg (ref 26.0–34.0)
MCHC: 33.1 g/dL (ref 30.0–36.0)
MCV: 88.9 fL (ref 80.0–100.0)
Monocytes Absolute: 0.5 10*3/uL (ref 0.1–1.0)
Monocytes Relative: 11 %
Neutro Abs: 2.3 10*3/uL (ref 1.7–7.7)
Neutrophils Relative %: 56 %
Platelet Count: 163 10*3/uL (ref 150–400)
RBC: 4.07 MIL/uL (ref 3.87–5.11)
RDW: 13.6 % (ref 11.5–15.5)
WBC Count: 4.1 10*3/uL (ref 4.0–10.5)
nRBC: 0 % (ref 0.0–0.2)

## 2020-05-29 LAB — CMP (CANCER CENTER ONLY)
ALT: 30 U/L (ref 0–44)
AST: 22 U/L (ref 15–41)
Albumin: 3.5 g/dL (ref 3.5–5.0)
Alkaline Phosphatase: 81 U/L (ref 38–126)
Anion gap: 8 (ref 5–15)
BUN: 12 mg/dL (ref 6–20)
CO2: 26 mmol/L (ref 22–32)
Calcium: 9.3 mg/dL (ref 8.9–10.3)
Chloride: 107 mmol/L (ref 98–111)
Creatinine: 0.78 mg/dL (ref 0.44–1.00)
GFR, Est AFR Am: >60 mL/min (ref >60)
GFR, Estimated: >60 mL/min (ref >60)
Glucose, Bld: 121 mg/dL — ABNORMAL HIGH (ref 70–99)
Potassium: 4.1 mmol/L (ref 3.5–5.1)
Sodium: 141 mmol/L (ref 135–145)
Total Bilirubin: 0.4 mg/dL (ref 0.3–1.2)
Total Protein: 7.1 g/dL (ref 6.5–8.1)

## 2020-05-29 NOTE — Telephone Encounter (Signed)
Scheduled appointment per 10/1 los. Gave patient updated calendar.

## 2020-05-29 NOTE — Patient Instructions (Signed)

## 2020-06-05 ENCOUNTER — Other Ambulatory Visit: Payer: Self-pay

## 2020-06-05 ENCOUNTER — Telehealth: Payer: Self-pay

## 2020-06-05 DIAGNOSIS — C50211 Malignant neoplasm of upper-inner quadrant of right female breast: Secondary | ICD-10-CM

## 2020-06-05 MED ORDER — ANASTROZOLE 1 MG PO TABS: ORAL_TABLET | ORAL | 0 refills | Status: DC

## 2020-06-05 NOTE — Telephone Encounter (Signed)
Called pt to inform her that orders have been faxed to [facility]. Their fax # is [phone] and phone # is [phone]. Pt understands her appt has been scheduled for 06/11/20 at 0930. On faxed order, it was indicated to fax results to [phone].

## 2020-06-24 ENCOUNTER — Telehealth: Payer: Self-pay

## 2020-06-24 NOTE — Telephone Encounter (Signed)
Fax received from [facility] statin pt did not show for her Bone Density scan 06/11/20.   This LPN attempted to call pt to inform her, but no answer. LVM explaining to pt she should call to r/s this appt at [phone] and if she has any questions or needs assistance to call us back.

## 2020-07-13 ENCOUNTER — Telehealth: Payer: Self-pay

## 2020-07-13 NOTE — Telephone Encounter (Signed)
This LPN called pt to recommend Calcium/Vit D supplement per Wilber Bihari, NP as pt shows some mild osteopenia. Pt was informed, and advised to repeat scan in 2 years, as well as recommending scan at same facility. Pt verbalized thanks and understanding. Knows to call with any concerns.

## 2020-09-17 ENCOUNTER — Other Ambulatory Visit: Payer: Self-pay

## 2020-09-17 DIAGNOSIS — C50211 Malignant neoplasm of upper-inner quadrant of right female breast: Secondary | ICD-10-CM

## 2020-09-17 MED ORDER — ANASTROZOLE 1 MG PO TABS: ORAL_TABLET | ORAL | 0 refills | Status: DC

## 2020-09-24 ENCOUNTER — Telehealth: Payer: Self-pay

## 2020-09-24 NOTE — Telephone Encounter (Signed)
Returned call to pt about medication/ message received from pt stating medication was sent to incorrect pharmacy would like medication to be sent to pharmacy in [place]   left message on pt cell phone to please return call so discrepancy can be corrected

## 2020-10-30 ENCOUNTER — Other Ambulatory Visit: Payer: Self-pay

## 2020-10-30 DIAGNOSIS — C50211 Malignant neoplasm of upper-inner quadrant of right female breast: Secondary | ICD-10-CM

## 2020-10-30 MED ORDER — ANASTROZOLE 1 MG PO TABS: ORAL_TABLET | ORAL | 0 refills | Status: DC

## 2021-01-18 ENCOUNTER — Other Ambulatory Visit: Payer: Self-pay | Admitting: Oncology

## 2021-01-18 DIAGNOSIS — C50211 Malignant neoplasm of upper-inner quadrant of right female breast: Secondary | ICD-10-CM

## 2021-05-09 ENCOUNTER — Other Ambulatory Visit: Payer: Self-pay | Admitting: Oncology

## 2021-05-09 DIAGNOSIS — C50211 Malignant neoplasm of upper-inner quadrant of right female breast: Secondary | ICD-10-CM

## 2021-05-31 ENCOUNTER — Telehealth: Payer: Self-pay | Admitting: Oncology

## 2021-05-31 NOTE — Telephone Encounter (Signed)
Scheduled appointment per 10/01 sch msg. Patient is aware.  

## 2021-06-01 ENCOUNTER — Inpatient Hospital Stay: Payer: 59

## 2021-06-01 ENCOUNTER — Inpatient Hospital Stay: Payer: 59 | Admitting: Oncology

## 2021-07-06 ENCOUNTER — Other Ambulatory Visit: Payer: Self-pay

## 2021-07-06 DIAGNOSIS — C50211 Malignant neoplasm of upper-inner quadrant of right female breast: Secondary | ICD-10-CM

## 2021-07-06 NOTE — Progress Notes (Signed)
ID: Jorje Guild OB: 1961/08/27  MR#: [id number]  FTD#:[id number]  Patient Care Team: Pcp, No as PCP - General Rodriguez Aguinaldo, Valentino Hue, MD as Consulting Physician (Oncology) Leeroy Cha, MD as Consulting Physician (Obstetrics and Gynecology) Milagros Evener, MD as Consulting Physician (Psychiatry) OTHER MD: Dr. Lyanne Co (PCP)  CHIEF COMPLAINT: Estrogen receptor positive breast cancer  CURRENT TREATMENT: Completing 5 years of anastrozole   INTERVAL HISTORY: Jean [name] returns today for followup of her estrogen receptor positive breast cancer.   She continues on anastrozole.  She really has not had hot flashes associated with this and has tolerated well.  At the same time she is looking forward to coming off the medication.  Since her last visit, she underwent repeat bone density screening on 07/08/2020 at [facility] showing a T-score of -1.9, which is considered osteopenic. This is decreased from -1.0 in 2017.  She is  scheduled for bilateral mammography 07/23/2021.  Last mammogram 07/08/2020 showed a breast density category B.   REVIEW OF SYSTEMS: Jean [name] is working hard at losing weight and at present tells me she is lost about 15 pounds using a diet app where she measures all the calories that she takes in.  She is trying to exercise more, currently about 15 minutes a day.  Otherwise a detailed review of systems today was stable   BREAST CANCER HISTORY: From the original intake note:   "Jean [name]" had routine mammographic screening 10/02/2012 showing dense breasts (category C.). A possible distortion was noted in the right breast and a diagnostic right mammography and ultrasonography on 01/02/2013 confirmed a persistent area of distortion in the upper inner quadrant of the right breast measuring approximately 1 cm. This was not palpable. Ultrasound was not informative and showed no axillary adenopathy. Stereotactic biopsy of the right breast mass 01/22/2013 showed an invasive  ductal carcinoma, grade 2, estrogen receptor 90%, progesterone receptor 90%, with no HER-2 amplification (ratio 1.6 by C[facility]) and an MIB-1 of 10%.   Bilateral breast MRIs 01/28/2013 showed a large hematoma at the biopsy site, measuring up to 6.1 cm, with minimal patchy enhancing nodularity at the periphery. There was a 1.7 cm lymph node in the right axillary region with a preserved fatty hilum but slightly thickened cortex. There were no other findings of concern.   The patient's subsequent history is as detailed below   PAST MEDICAL HISTORY: Past Medical History:  Diagnosis Date   Arthritis    Breast cancer (HCC)    COPD with asthma (HCC)    Depression    takes Viibryd daily as well as Abilify   GERD (gastroesophageal reflux disease)    related to Xarelto;takes Omeprazole daily   Hx of radiation therapy 03/25/13- 05/06/13   right breast 4500 cGy 25 sessions, right breast boost 1000 cGy 5 sessions   Hyperlipidemia    takes Lipitor daily   Joint pain    Nasal congestion    takes Sudafed if needed)   Nocturia    Obesity    Personal history of radiation therapy    Pneumonia    hx of;last time around 1987   PUD (peptic ulcer disease)    remote   Pulmonary embolism (HCC) 2012   takes Xarelto daily   Stress incontinence    Vertigo    pt doesn't take any medication    PAST SURGICAL HISTORY: Past Surgical History:  Procedure Laterality Date   ADENOIDECTOMY  1967   BREAST BIOPSY  May 2014   stage I beast  cancer   BREAST LUMPECTOMY Right 02/07/2013   BREAST LUMPECTOMY WITH NEEDLE LOCALIZATION AND AXILLARY SENTINEL LYMPH NODE BX Right 02/07/2013   Procedure: BREAST LUMPECTOMY WITH NEEDLE LOCALIZATION AND AXILLARY SENTINEL LYMPH NODE BX;  Surgeon: Shelly Rubenstein, MD;  Location: [facility] OR;  Service: General;  Laterality: Right;    FAMILY HISTORY Family History  Adopted: Yes  Problem Relation Age of Onset   Breast cancer Maternal Aunt 60   Breast cancer Maternal Grandmother         post menopausal breast cancer   Breast cancer Cousin 40       possible bilateral cancer   Stomach cancer Neg Hx    Pancreatic cancer Neg Hx    Colon cancer Neg Hx    the patient is adopted and has no information on her father's side of the family. Her biological mother is currently 16. Her biological mothers mother had a diagnosis of breast cancer but the patient does not know at what age. Also 1 maternal aunt was diagnosed with breast cancer as well as a first cousin, and a first cousin was diagnosed at age 53. The patient was tested for the BRCA mutation and is negative.   GYNECOLOGIC HISTORY:  Menarche age 67, perimenopause at around age 44. The patient then took hormone replacement for 5 years, quitting at age 41 when she developed a pulmonary embolus. She never took birth control pills. She is GX P0.   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 In [place] she visits her adoptive mother [name] and her adoptive brother [name]. The patient's adoptive father is currently at [facility] following orthopedic surgery. There is a second adoptive brother. The patient married her partner in 2014 and moved in with her and her 4 children in [place]. She wishes to maintain her medical care here since her mother will not be moving and she will be coming to this area frequently.. The only pet at home is a Estate manager/land agent    ADVANCED DIRECTIVES: In place. The patient has named her adoptive mother, [name], as her healthcare power of attorney. Barbara's cell number is [phone]   HEALTH MAINTENANCE: Social History   Tobacco Use   Smoking status: Never   Smokeless tobacco: Never  Substance Use Topics   Alcohol use: No   Drug use: No     Colonoscopy:  PAP: 2014  Bone density: 12/03/2015 showed a T score of -1.0 normal  Lipid panel:  Allergies  Allergen Reactions   Codeine Shortness Of Breath   Mobic [Meloxicam] Shortness Of Breath     Current Outpatient Medications  Medication Sig Dispense Refill   amphetamine-dextroamphetamine (ADDERALL) 10 MG tablet Take 1 tablet by mouth daily.     anastrozole (ARIMIDEX) 1 MG tablet TAKE 1 TABLET BY MOUTH EVERY DAY 90 tablet 0   ARIPiprazole (ABILIFY) 5 MG tablet Take 2.5 mg by mouth daily. Pt takes 1/2 tablet (2.5 mg) daily.     atorvastatin (LIPITOR) 20 MG tablet Take 20 mg by mouth daily.     celecoxib (CELEBREX) 200 MG capsule Take 200 mg by mouth daily.      cholecalciferol (VITAMIN D) 1000 units tablet Take 1,000 Units by mouth daily.     ketoconazole (NIZORAL) 2 % cream Apply 1 application topically daily. 15 g 0   losartan (COZAAR) 25 MG tablet Take 1 tablet (25 mg total) by mouth daily.     metFORMIN (GLUCOPHAGE) 1000 MG tablet Take  1 tablet (1,000 mg total) by mouth 2 (two) times daily with a meal.     Semaglutide, 1 MG/DOSE, (OZEMPIC, 1 MG/DOSE,) 2 MG/1.5ML SOPN Inject into the skin.     Vilazodone HCl (VIIBRYD) 40 MG TABS Take 40 mg by mouth daily.      XARELTO 2.5 MG TABS tablet Take by mouth.     No current facility-administered medications for this visit.    OBJECTIVE: White woman in no acute distress  Vitals:   07/07/21 1039  BP: 130/74  Pulse: 76  Resp: 18  Temp: 97.6 F (36.4 C)  SpO2: 95%      Body mass index is 45.25 kg/m.    ECOG FS: 1  Sclerae unicteric, EOMs intact Wearing a mask; rosacea noted No cervical or supraclavicular adenopathy Lungs no rales or rhonchi Heart regular rate and rhythm Abd soft, nontender, positive bowel sounds MSK no focal spinal tenderness, no upper extremity lymphedema Neuro: nonfocal, well oriented, appropriate affect Breasts: The right breast is status postlumpectomy followed by radiation.  There is no evidence of local recurrence.  The left breast and both axillae are benign.   LAB RESULTS:  CMP     Component Value Date/Time   NA 140 07/07/2021 1023   NA 140 06/06/2016 1509   K 4.1 07/07/2021 1023   K  4.2 06/06/2016 1509   CL 107 07/07/2021 1023   CL 105 01/30/2013 0823   CO2 23 07/07/2021 1023   CO2 25 06/06/2016 1509   GLUCOSE 104 (H) 07/07/2021 1023   GLUCOSE 192 (H) 06/06/2016 1509   GLUCOSE 104 (H) 01/30/2013 0823   BUN 10 07/07/2021 1023   BUN 13.9 06/06/2016 1509   CREATININE 0.75 07/07/2021 1023   CREATININE 0.9 06/06/2016 1509   CALCIUM 9.3 07/07/2021 1023   CALCIUM 9.1 06/06/2016 1509   PROT 7.5 07/07/2021 1023   PROT 7.4 06/06/2016 1509   ALBUMIN 3.9 07/07/2021 1023   ALBUMIN 3.4 (L) 06/06/2016 1509   AST 30 07/07/2021 1023   AST 13 06/06/2016 1509   ALT 41 07/07/2021 1023   ALT 17 06/06/2016 1509   ALKPHOS 87 07/07/2021 1023   ALKPHOS 123 06/06/2016 1509   BILITOT 0.6 07/07/2021 1023   BILITOT 0.40 06/06/2016 1509   GFRNONAA >60 07/07/2021 1023   GFRAA >60 05/29/2020 0920    I No results found for: SPEP  Lab Results  Component Value Date   WBC 5.2 07/07/2021   NEUTROABS 2.2 07/07/2021   HGB 12.8 07/07/2021   HCT 38.9 07/07/2021   MCV 88.4 07/07/2021   PLT 196 07/07/2021      Chemistry      Component Value Date/Time   NA 140 07/07/2021 1023   NA 140 06/06/2016 1509   K 4.1 07/07/2021 1023   K 4.2 06/06/2016 1509   CL 107 07/07/2021 1023   CL 105 01/30/2013 0823   CO2 23 07/07/2021 1023   CO2 25 06/06/2016 1509   BUN 10 07/07/2021 1023   BUN 13.9 06/06/2016 1509   CREATININE 0.75 07/07/2021 1023   CREATININE 0.9 06/06/2016 1509      Component Value Date/Time   CALCIUM 9.3 07/07/2021 1023   CALCIUM 9.1 06/06/2016 1509   ALKPHOS 87 07/07/2021 1023   ALKPHOS 123 06/06/2016 1509   AST 30 07/07/2021 1023   AST 13 06/06/2016 1509   ALT 41 07/07/2021 1023   ALT 17 06/06/2016 1509   BILITOT 0.6 07/07/2021 1023   BILITOT 0.40 06/06/2016 1509  No results found for: LABCA2  No components found for: [id number]  No results for input(s): INR in the last 168 hours.  Urinalysis No results found for: COLORURINE   STUDIES: No results  found.    ASSESSMENT: 60 y.o. BRCA negative [place] woman status post right breast biopsy 01/22/2013 for a clinical T1b NX, stage IA invasive ductal carcinoma, grade 2, estrogen receptor 90% positive, progesterone receptor 90% positive, with no HER-2 amplification and an MIB-1 of 10%.  (1) Status post right lumpectomy and sentinel lymph node sampling 02/07/2013 for a pT1b pN0, stage IA invasive ductal breast cancer, grade 2, repeat HER-2 again negative  (2) Oncotype score of 11 predicts a distant recurrence rate within 10 years of 8% if the patient's only systemic treatment is tamoxifen for 5 years  (3) adjuvant radiation completed 04/30/2013  (4) anastrozole started October 2014--continued through November 2022 (total 7 years)  (a) normal bone density November 2014  (b) normal bone density 12/03/2015 with a T score of -1.0  (c ) bone density 07/08/2020 shows a T score of -1.9 (osteopenia    PLAN:  Jean [name] is 8-1/2 years out from definitive surgery for her breast cancer with no evidence of disease recurrence.  This is very favorable.  She has completed 7 years on anastrozole.  She understands that we have data comparing 7 and 10 years on aromatase inhibitors and there really is no additional benefit from the last 3 years.  Accordingly she is going off anastrozole at this point.  Anticipates she will feel a sense of wellbeing improved over the next couple of months but since she tolerated it well I do not anticipate other significant changes in symptomatology or functional status  I commended her weight loss and exercise program.  At this point I feel comfortable releasing her to her primary care physician.  All she will need in terms of breast cancer follow-up is her yearly mammography and a yearly physician breast exam  We will be glad to see Jean [name] again at any point in the future if and when the need arises but as of now are making no further routine appointments for her here.  Total  encounter time 25 minutes.Raymond Gurney C. Keithon Mccoin, MD 07/08/21 8:00 AM Medical Oncology and Hematology [facility] [address] [place], [place] [address] Tel. [phone]    Fax. [phone]   I, Mickie Bail, am acting as scribe for Dr. Valentino Hue. Remigio Mcmillon.  I, Ruthann Cancer MD, have reviewed the above documentation for accuracy and completeness, and I agree with the above.    *Total Encounter Time as defined by the Centers for Medicare and Medicaid Services includes, in addition to the face-to-face time of a patient visit (documented in the note above) non-face-to-face time: obtaining and reviewing outside history, ordering and reviewing medications, tests or procedures, care coordination (communications with other health care professionals or caregivers) and documentation in the medical record.

## 2021-07-07 ENCOUNTER — Inpatient Hospital Stay: Payer: 59 | Attending: Oncology

## 2021-07-07 ENCOUNTER — Inpatient Hospital Stay: Payer: 59 | Admitting: Oncology

## 2021-07-07 ENCOUNTER — Other Ambulatory Visit: Payer: Self-pay

## 2021-07-07 VITALS — BP 130/74 | HR 76 | Temp 97.6°F | Resp 18 | Ht 65.0 in | Wt 271.9 lb

## 2021-07-07 DIAGNOSIS — Z853 Personal history of malignant neoplasm of breast: Secondary | ICD-10-CM | POA: Insufficient documentation

## 2021-07-07 DIAGNOSIS — Z170 Estrogen receptor positive status [ER+]: Secondary | ICD-10-CM

## 2021-07-07 DIAGNOSIS — Z9223 Personal history of estrogen therapy: Secondary | ICD-10-CM | POA: Insufficient documentation

## 2021-07-07 DIAGNOSIS — Z923 Personal history of irradiation: Secondary | ICD-10-CM | POA: Insufficient documentation

## 2021-07-07 DIAGNOSIS — C50211 Malignant neoplasm of upper-inner quadrant of right female breast: Secondary | ICD-10-CM | POA: Diagnosis not present

## 2021-07-07 LAB — CBC WITH DIFFERENTIAL (CANCER CENTER ONLY)
Abs Immature Granulocytes: 0.01 10*3/uL (ref 0.00–0.07)
Basophils Absolute: 0 10*3/uL (ref 0.0–0.1)
Basophils Relative: 0 %
Eosinophils Absolute: 1.1 10*3/uL — ABNORMAL HIGH (ref 0.0–0.5)
Eosinophils Relative: 22 %
HCT: 38.9 % (ref 36.0–46.0)
Hemoglobin: 12.8 g/dL (ref 12.0–15.0)
Immature Granulocytes: 0 %
Lymphocytes Relative: 25 %
Lymphs Abs: 1.3 10*3/uL (ref 0.7–4.0)
MCH: 29.1 pg (ref 26.0–34.0)
MCHC: 32.9 g/dL (ref 30.0–36.0)
MCV: 88.4 fL (ref 80.0–100.0)
Monocytes Absolute: 0.6 10*3/uL (ref 0.1–1.0)
Monocytes Relative: 11 %
Neutro Abs: 2.2 10*3/uL (ref 1.7–7.7)
Neutrophils Relative %: 42 %
Platelet Count: 196 10*3/uL (ref 150–400)
RBC: 4.4 MIL/uL (ref 3.87–5.11)
RDW: 14 % (ref 11.5–15.5)
WBC Count: 5.2 10*3/uL (ref 4.0–10.5)
nRBC: 0 % (ref 0.0–0.2)

## 2021-07-07 LAB — CMP (CANCER CENTER ONLY)
ALT: 41 U/L (ref 0–44)
AST: 30 U/L (ref 15–41)
Albumin: 3.9 g/dL (ref 3.5–5.0)
Alkaline Phosphatase: 87 U/L (ref 38–126)
Anion gap: 10 (ref 5–15)
BUN: 10 mg/dL (ref 6–20)
CO2: 23 mmol/L (ref 22–32)
Calcium: 9.3 mg/dL (ref 8.9–10.3)
Chloride: 107 mmol/L (ref 98–111)
Creatinine: 0.75 mg/dL (ref 0.44–1.00)
GFR, Estimated: >60 mL/min (ref >60)
Glucose, Bld: 104 mg/dL — ABNORMAL HIGH (ref 70–99)
Potassium: 4.1 mmol/L (ref 3.5–5.1)
Sodium: 140 mmol/L (ref 135–145)
Total Bilirubin: 0.6 mg/dL (ref 0.3–1.2)
Total Protein: 7.5 g/dL (ref 6.5–8.1)

## 2024-05-20 NOTE — Progress Notes (Signed)
 This encounter was created in error - please disregard.
